# Patient Record
Sex: Female | Born: 1970 | Race: White | Hispanic: No | Marital: Married | Smoking: Never smoker
Health system: Southern US, Community
[De-identification: ages and names within clinical notes are randomized; demographics above are authoritative.]

## PROBLEM LIST (undated history)

## (undated) DIAGNOSIS — I10 Essential (primary) hypertension: Secondary | ICD-10-CM

## (undated) HISTORY — DX: Essential (primary) hypertension: I10

---

## 2005-01-09 HISTORY — PX: GALLBLADDER SURGERY: SHX652

## 2005-06-29 ENCOUNTER — Ambulatory Visit: Payer: Self-pay | Admitting: Internal Medicine

## 2005-09-18 ENCOUNTER — Other Ambulatory Visit: Payer: Self-pay

## 2005-09-25 ENCOUNTER — Ambulatory Visit: Payer: Self-pay | Admitting: Surgery

## 2010-12-22 ENCOUNTER — Ambulatory Visit: Payer: Self-pay | Admitting: Obstetrics and Gynecology

## 2011-03-23 ENCOUNTER — Ambulatory Visit: Payer: Self-pay | Admitting: Obstetrics and Gynecology

## 2013-06-13 ENCOUNTER — Ambulatory Visit: Payer: Self-pay | Admitting: Physician Assistant

## 2014-01-20 ENCOUNTER — Ambulatory Visit: Payer: Self-pay

## 2014-08-12 ENCOUNTER — Other Ambulatory Visit: Payer: Self-pay | Admitting: Family Medicine

## 2014-08-12 DIAGNOSIS — I10 Essential (primary) hypertension: Secondary | ICD-10-CM

## 2014-08-12 NOTE — Telephone Encounter (Signed)
Please add for me and then send as med refill. Thanks.

## 2014-08-12 NOTE — Telephone Encounter (Signed)
Per allscripts patient takes Diovan 160/12.5mg  daily. Can you send in med without it being in her chart? I've tried exporting chart to epic. Im not sure how long it takes before you can see med in EMR.

## 2014-08-12 NOTE — Telephone Encounter (Signed)
Pt called needing refill on her diovan  mg ? bid.  She said you have not prescribed this for her but she talked to you about it and you said to call when you needed a refill..  [facility] [place].    Thanks Barth Kirks

## 2014-08-18 MED ORDER — VALSARTAN-HYDROCHLOROTHIAZIDE 320-12.5 MG PO TABS: 1.0000 | ORAL_TABLET | Freq: Every day | ORAL | Status: DC

## 2014-08-18 NOTE — Telephone Encounter (Signed)
Dr. Judie Petit, have you already sent this in for patient? Looks like you have, but I just wanted to be sure. Thanks!

## 2014-08-18 NOTE — Telephone Encounter (Signed)
Pt has called back  .  She is almost out of her medication.    She uses [facility] in [place].

## 2014-08-20 ENCOUNTER — Telehealth: Payer: Self-pay | Admitting: Family Medicine

## 2014-08-20 DIAGNOSIS — I10 Essential (primary) hypertension: Secondary | ICD-10-CM | POA: Insufficient documentation

## 2014-08-20 MED ORDER — VALSARTAN-HYDROCHLOROTHIAZIDE 160-12.5 MG PO TABS: 1.0000 | ORAL_TABLET | Freq: Two times a day (BID) | ORAL | Status: DC

## 2014-08-20 NOTE — Telephone Encounter (Signed)
Pt stated the wrong dose pf Valsartan was sent into the pharmacy and she would like the correct dose 160-12.5 mg twice a day sent in today because she will take her last dose tonight. Thanks TNP

## 2014-08-24 ENCOUNTER — Ambulatory Visit (INDEPENDENT_AMBULATORY_CARE_PROVIDER_SITE_OTHER): Payer: BC Managed Care – PPO | Admitting: Family Medicine

## 2014-08-24 ENCOUNTER — Ambulatory Visit: Payer: Self-pay | Admitting: Family Medicine

## 2014-08-24 ENCOUNTER — Encounter: Payer: Self-pay | Admitting: Family Medicine

## 2014-08-24 VITALS — BP 120/76 | HR 78 | Temp 98.3°F | Resp 16 | Ht 63.0 in | Wt 243.6 lb

## 2014-08-24 DIAGNOSIS — J309 Allergic rhinitis, unspecified: Secondary | ICD-10-CM | POA: Insufficient documentation

## 2014-08-24 DIAGNOSIS — L0293 Carbuncle, unspecified: Secondary | ICD-10-CM

## 2014-08-24 DIAGNOSIS — I10 Essential (primary) hypertension: Secondary | ICD-10-CM | POA: Insufficient documentation

## 2014-08-24 DIAGNOSIS — D649 Anemia, unspecified: Secondary | ICD-10-CM | POA: Insufficient documentation

## 2014-08-24 MED ORDER — DOXYCYCLINE HYCLATE 100 MG PO TABS: 100.0000 mg | ORAL_TABLET | Freq: Two times a day (BID) | ORAL | Status: DC

## 2014-08-24 NOTE — Progress Notes (Signed)
Subjective:     Patient ID: Erin Frye, female   DOB: 09/06/1970, 44 y.o.   MRN: [id number]  HPI  Chief Complaint  Patient presents with  . Skin Problem    Patient comes in office today with concerns of possible cyst/boil on her lower left abdomen that has been present since 8/13. Patient states that area is painful and she has been able to squeeze white pus from site.   States she has not had a cyst in this area. Reports son has has MRSA infections.   Review of Systems  Constitutional: Negative for fever and chills.       Objective:   Physical Exam  Constitutional: She appears well-developed and well-nourished. No distress.  Skin:  Left lower abdomen with tender, non-draining carbuncle. Underlying induration appreciated but no drainage on palpation.       Assessment:    1. Carbuncle - doxycycline (VIBRA-TABS) 100 MG tablet; Take 1 tablet (100 mg total) by mouth 2 (two) times daily.  Dispense: 14 tablet; Refill: 0    Plan:    Discussed use of warm compresses.

## 2014-08-24 NOTE — Patient Instructions (Signed)
Discussed use of warm compresses several x day. 

## 2014-12-04 ENCOUNTER — Encounter: Payer: Self-pay | Admitting: Physician Assistant

## 2014-12-04 ENCOUNTER — Ambulatory Visit (INDEPENDENT_AMBULATORY_CARE_PROVIDER_SITE_OTHER): Payer: BC Managed Care – PPO | Admitting: Physician Assistant

## 2014-12-04 VITALS — BP 120/70 | HR 87 | Temp 97.8°F | Resp 16 | Wt 244.0 lb

## 2014-12-04 DIAGNOSIS — R059 Cough, unspecified: Secondary | ICD-10-CM

## 2014-12-04 DIAGNOSIS — R05 Cough: Secondary | ICD-10-CM

## 2014-12-04 DIAGNOSIS — J40 Bronchitis, not specified as acute or chronic: Secondary | ICD-10-CM

## 2014-12-04 MED ORDER — AZITHROMYCIN 250 MG PO TABS: ORAL_TABLET | ORAL | Status: DC

## 2014-12-04 MED ORDER — HYDROCODONE-HOMATROPINE 5-1.5 MG/5ML PO SYRP
5.0000 mL | ORAL_SOLUTION | Freq: Three times a day (TID) | ORAL | Status: DC | PRN
Start: 2014-12-04 — End: 2015-02-12

## 2014-12-04 NOTE — Progress Notes (Signed)
Patient: Erin Frye Female    DOB: 08/12/1970   44 y.o.   MRN: [id number] Visit Date: 12/04/2014  Today's Provider: Margaretann LovelessJennifer M Doris Gruhn, PA-C   No chief complaint on file.  Subjective:    URI  This is a new problem. The current episode started in the past 7 days. There has been no fever. Associated symptoms include coughing (gets worst in the evening), headaches, a plugged ear sensation and wheezing. Pertinent negatives include no ear pain, sinus pain, sneezing or sore throat. She has tried antihistamine, decongestant and acetaminophen for the symptoms.  She was recently treated on November 4 for sinus infection with amoxicillin. She stated that she did get a little bit better but then started to show symptoms again. She states that this time she has a worsening cough. Her cough bothers her most in the evening and when she lies down.     Allergies  Allergen Reactions  . Codeine     itching   Previous Medications   ASCORBIC ACID (VITAMIN C) 100 MG TABLET    Take by mouth.   FERROUS SULFATE 325 (65 FE) MG TABLET    Take by mouth.   FEXOFENADINE (ALLEGRA) 180 MG TABLET    Take by mouth.   FLUTICASONE (FLONASE ALLERGY RELIEF) 50 MCG/ACT NASAL SPRAY    Place into the nose.   MELOXICAM (MOBIC) 7.5 MG TABLET    take 1 tablet by mouth twice a day after meals   MULTIPLE VITAMIN PO    Take by mouth.   NAPROXEN SODIUM (ANAPROX) 550 MG TABLET       VALSARTAN-HYDROCHLOROTHIAZIDE (DIOVAN HCT) 160-12.5 MG PER TABLET    Take 1 tablet by mouth 2 (two) times daily.    Review of Systems  Constitutional: Negative.   HENT: Negative for ear pain, sneezing and sore throat.   Respiratory: Positive for cough (gets worst in the evening), chest tightness and wheezing.   Cardiovascular: Negative.   Musculoskeletal: Positive for back pain (upper back. It hurst when coughing and breathing).  Neurological: Positive for headaches.    Social History  Substance Use Topics  . Smoking  status: Never Smoker   . Smokeless tobacco: Not on file  . Alcohol Use: 0.0 oz/week    0 Standard drinks or equivalent per week     Comment: rare   Objective:   BP 120/70 mmHg  Pulse 87  Temp(Src) 97.8 F (36.6 C) (Oral)  Resp 16  Wt 244 lb (110.678 kg)  LMP 12/02/2014  Physical Exam  Constitutional: She appears well-developed and well-nourished. No distress.  HENT:  Head: Normocephalic and atraumatic.  Right Ear: Hearing, tympanic membrane, external ear and ear canal normal.  Left Ear: Hearing, tympanic membrane, external ear and ear canal normal.  Nose: Nose normal.  Mouth/Throat: Uvula is midline, oropharynx is clear and moist and mucous membranes are normal. No oropharyngeal exudate.  Eyes: Conjunctivae are normal. Pupils are equal, round, and reactive to light. Right eye exhibits no discharge. Left eye exhibits no discharge. No scleral icterus.  Neck: Normal range of motion. Neck supple. No tracheal deviation present. No thyromegaly present.  Cardiovascular: Normal rate, regular rhythm and normal heart sounds.  Exam reveals no gallop and no friction rub.   No murmur heard. Pulmonary/Chest: Effort normal. No stridor. No respiratory distress. She has decreased breath sounds. She has no wheezes. She has no rhonchi. She has no rales.  Lymphadenopathy:    She has no  cervical adenopathy.  Skin: Skin is warm and dry. She is not diaphoretic.  Vitals reviewed.       Assessment & Plan:     1. Cough Worsening. Will treat with Hycodan cough syrup for nighttime cough. She may take as directed before sleep help her sleep at night. She is to call the office if her symptoms fail to improve or worsen. - HYDROcodone-homatropine (HYCODAN) 5-1.5 MG/5ML syrup; Take 5 mLs by mouth every 8 (eight) hours as needed for cough.  Dispense: 120 mL; Refill: 0  2. Bronchitis Worsening. Will treat with a Z-Pak as below. She's previously been treated with amoxicillin for sinusitis approximately one  month ago. Advised her to try to rest and to make sure to drink plenty of fluids to stay hydrated. She is to call the office if symptoms fail to improve or worsen. May consider chest x-ray if cough does not improve with treatment or if shortness of breath worsens. - azithromycin (ZITHROMAX) 250 MG tablet; Take 2 tablets PO on day one, and one tablet PO daily thereafter until completed.  Dispense: 6 tablet; Refill: 0       Margaretann Loveless, PA-C  [facility]

## 2014-12-04 NOTE — Patient Instructions (Signed)

## 2015-01-27 ENCOUNTER — Other Ambulatory Visit: Payer: Self-pay

## 2015-01-27 DIAGNOSIS — I10 Essential (primary) hypertension: Secondary | ICD-10-CM

## 2015-01-27 MED ORDER — VALSARTAN-HYDROCHLOROTHIAZIDE 160-12.5 MG PO TABS: 1.0000 | ORAL_TABLET | Freq: Two times a day (BID) | ORAL | Status: DC

## 2015-01-28 ENCOUNTER — Telehealth: Payer: Self-pay | Admitting: Family Medicine

## 2015-01-28 NOTE — Telephone Encounter (Signed)
Needs ov to review labs from GYN.  Thanks.

## 2015-01-28 NOTE — Telephone Encounter (Signed)
Left message to call back  

## 2015-01-28 NOTE — Telephone Encounter (Signed)
Advised patient as below. Patient reports that she will give [place] a call back to schedule appt.

## 2015-01-29 ENCOUNTER — Other Ambulatory Visit: Payer: Self-pay

## 2015-01-29 DIAGNOSIS — I10 Essential (primary) hypertension: Secondary | ICD-10-CM

## 2015-01-29 MED ORDER — VALSARTAN-HYDROCHLOROTHIAZIDE 160-12.5 MG PO TABS: 1.0000 | ORAL_TABLET | Freq: Two times a day (BID) | ORAL | Status: DC

## 2015-02-03 ENCOUNTER — Encounter: Payer: Self-pay | Admitting: Family Medicine

## 2015-02-12 ENCOUNTER — Encounter: Payer: Self-pay | Admitting: Physician Assistant

## 2015-02-12 ENCOUNTER — Ambulatory Visit (INDEPENDENT_AMBULATORY_CARE_PROVIDER_SITE_OTHER): Payer: BC Managed Care – PPO | Admitting: Physician Assistant

## 2015-02-12 VITALS — BP 108/72 | HR 86 | Resp 14 | Wt 240.0 lb

## 2015-02-12 DIAGNOSIS — E782 Mixed hyperlipidemia: Secondary | ICD-10-CM | POA: Diagnosis not present

## 2015-02-12 DIAGNOSIS — R7303 Prediabetes: Secondary | ICD-10-CM | POA: Insufficient documentation

## 2015-02-12 DIAGNOSIS — R7309 Other abnormal glucose: Secondary | ICD-10-CM

## 2015-02-12 DIAGNOSIS — N951 Menopausal and female climacteric states: Secondary | ICD-10-CM | POA: Diagnosis not present

## 2015-02-12 NOTE — Progress Notes (Signed)
Patient ID: Erin Frye, female   DOB: [birth year], 45 y.o.   MRN: [id number]       Patient: Erin Frye Female    DOB: December 12, 1970   45 y.o.   MRN: [id number] Visit Date: 02/12/2015  Today's Provider: Margaretann Loveless, PA-C   Chief Complaint  Patient presents with  . Abnormal Lab   Subjective:    HPI   Erin Frye is a 45 yr old female that is here to discuss labs that she had done through [facility]. She was told her cholesterol was high, that she was perimenopausal, her sugar was high and she should take Vit D supplement.     Allergies  Allergen Reactions  . Codeine     itching   Previous Medications   ASCORBIC ACID (VITAMIN C) 100 MG TABLET    Take by mouth.   FERROUS SULFATE 325 (65 FE) MG TABLET    Take by mouth.   FEXOFENADINE (ALLEGRA) 180 MG TABLET    Take by mouth.   FLUTICASONE (FLONASE ALLERGY RELIEF) 50 MCG/ACT NASAL SPRAY    Place into the nose.   MULTIPLE VITAMIN PO    Take by mouth.   NAPROXEN SODIUM (ANAPROX) 550 MG TABLET       VALSARTAN-HYDROCHLOROTHIAZIDE (DIOVAN HCT) 160-12.5 MG TABLET    Take 1 tablet by mouth 2 (two) times daily.    Review of Systems  Constitutional: Negative.   Respiratory: Negative.   Cardiovascular: Negative.   Musculoskeletal: Negative.   Neurological: Negative.     Social History  Substance Use Topics  . Smoking status: Never Smoker   . Smokeless tobacco: Never Used  . Alcohol Use: 0.0 oz/week    0 Standard drinks or equivalent per week     Comment: rare   Objective:   BP 108/72 mmHg  Pulse 86  Resp 14  Wt 240 lb (108.863 kg)  Physical Exam  Constitutional: She appears well-developed and well-nourished. No distress.  Neck: Normal range of motion. Neck supple. No tracheal deviation present. No thyromegaly present.  Cardiovascular: Normal rate, regular rhythm and normal heart sounds.  Exam reveals no gallop and no friction rub.   No murmur heard. Pulmonary/Chest: Effort  normal and breath sounds normal. No respiratory distress. She has no wheezes. She has no rales.  Lymphadenopathy:    She has no cervical adenopathy.  Skin: She is not diaphoretic.  Vitals reviewed.       Assessment & Plan:     1. Hypercholesterolemia with hypertriglyceridemia Advised to continue her exercise routine that she has started in early January. She is going 3 days per week and working out for about hour each time. She is doing cardio and weight training. I also gave her information about the Mediterranean diet. I feel this may be a good diet that she can easily follow for lifestyle changes. She states that she will look into it and do research as she is very apt to change her diet. I also advised that she may begin taking red yeast rice daily. I will see her back in 6 months to recheck her cholesterol and hemoglobin A1c. I will also check her vitamin D at that time since her gynecologist recommended for her to start a vitamin D supplement. She states that she thinks she is already taking vitamin D as she takes a multivitamin daily. She is to call the office if she develops any acute issues, questions or concerns.  2. Perimenopausal  FSH and LH were increased indicating possible perimenopause. She does state that she has had irregular menstrual cycles for a while now. She denies any mood swings or hot flashes or other menopausal symptoms. She is followed by Karren Cobble at [facility].  3. Elevated hemoglobin A1c Hemoglobin A1c on December 28 was 6.2. She is making lifestyle changes including adding physical activity and trying to watch her diet. I will see her back in 6 months to recheck her hemoglobin A1c at that time. She is to call the office if she has any acute issues, questions or concerns.       Margaretann Loveless, PA-C  [facility]

## 2015-02-12 NOTE — Patient Instructions (Signed)

## 2015-02-23 ENCOUNTER — Encounter: Payer: Self-pay | Admitting: Family Medicine

## 2015-03-30 DIAGNOSIS — M170 Bilateral primary osteoarthritis of knee: Secondary | ICD-10-CM | POA: Insufficient documentation

## 2015-04-10 ENCOUNTER — Other Ambulatory Visit: Payer: Self-pay | Admitting: *Deleted

## 2015-04-10 DIAGNOSIS — I10 Essential (primary) hypertension: Secondary | ICD-10-CM

## 2015-04-10 MED ORDER — VALSARTAN-HYDROCHLOROTHIAZIDE 320-25 MG PO TABS: 1.0000 | ORAL_TABLET | Freq: Every day | ORAL | Status: DC

## 2015-05-20 ENCOUNTER — Ambulatory Visit (INDEPENDENT_AMBULATORY_CARE_PROVIDER_SITE_OTHER): Payer: BC Managed Care – PPO | Admitting: Physician Assistant

## 2015-05-20 ENCOUNTER — Encounter: Payer: Self-pay | Admitting: Physician Assistant

## 2015-05-20 VITALS — BP 118/82 | HR 81 | Temp 98.5°F | Resp 14 | Wt 233.8 lb

## 2015-05-20 DIAGNOSIS — L0293 Carbuncle, unspecified: Secondary | ICD-10-CM

## 2015-05-20 DIAGNOSIS — H6091 Unspecified otitis externa, right ear: Secondary | ICD-10-CM

## 2015-05-20 MED ORDER — DOXYCYCLINE HYCLATE 100 MG PO TABS: 100.0000 mg | ORAL_TABLET | Freq: Two times a day (BID) | ORAL | Status: DC

## 2015-05-20 NOTE — Progress Notes (Signed)
Patient ID: Erin Frye, female   DOB: 04/17/1970, 45 y.o.   MRN: [id number]   Patient: Erin Frye Female    DOB: 03/22/1970   [age] y.o.   MRN: [id number] Visit Date: 05/20/2015  Today's Provider: Margaretann LovelessJennifer M Torryn Fiske, PA-C   Chief Complaint  Patient presents with  . Cyst  . Ear Pain   Subjective:    Otalgia  There is pain in the right ear. This is a new problem. The current episode started in the past 7 days. The problem occurs constantly. The problem has been unchanged. Pertinent negatives include no ear discharge, rhinorrhea or sore throat. Associated symptoms comments: Ear soreness. She has tried acetaminophen for the symptoms. The treatment provided no relief.   Cyst:   Patient reports red, raised area on left side of abdomen X 3 days. Symptoms include redness, heat, and discharge which have worsen since onset. She has had another once previously on lower abdomen last year in August.   Previous Medications   FERROUS SULFATE 325 (65 FE) MG TABLET    Take by mouth.   FEXOFENADINE (ALLEGRA) 180 MG TABLET    Take by mouth.   FLUTICASONE (FLONASE ALLERGY RELIEF) 50 MCG/ACT NASAL SPRAY    Place into the nose.   MULTIPLE VITAMIN PO    Take by mouth.   VALSARTAN-HYDROCHLOROTHIAZIDE (DIOVAN-HCT) 160-12.5 MG TABLET    Take 1 tablet by mouth 2 (two) times daily.   Allergies  Allergen Reactions  . Codeine     itching    Review of Systems  Constitutional: Negative.   HENT: Positive for ear pain. Negative for ear discharge, postnasal drip, rhinorrhea, sinus pressure, sneezing, sore throat and tinnitus.   Eyes: Negative.   Respiratory: Negative.   Cardiovascular: Negative.   Gastrointestinal: Negative.   Endocrine: Negative.   Genitourinary: Negative.   Musculoskeletal: Negative.   Skin: Positive for wound.       cyst  Allergic/Immunologic: Negative.   Neurological: Negative.   Hematological: Negative.   Psychiatric/Behavioral: Negative.     Social  History  Substance Use Topics  . Smoking status: Never Smoker   . Smokeless tobacco: Never Used  . Alcohol Use: 0.0 oz/week    0 Standard drinks or equivalent per week     Comment: rare   Objective:   BP 118/82 mmHg  Pulse 81  Temp(Src) 98.5 F (36.9 C) (Oral)  Resp 14  Wt 233 lb 12.8 oz (106.051 kg)  Physical Exam  Constitutional: She appears well-developed and well-nourished. No distress.  HENT:  Head: Normocephalic and atraumatic.  Right Ear: Hearing, tympanic membrane and ear canal normal. There is swelling. Tympanic membrane is not perforated, not erythematous and not bulging. No middle ear effusion.  Left Ear: Hearing, tympanic membrane, external ear and ear canal normal.  Nose: Nose normal. Right sinus exhibits no maxillary sinus tenderness and no frontal sinus tenderness. Left sinus exhibits no maxillary sinus tenderness and no frontal sinus tenderness.  Mouth/Throat: Uvula is midline, oropharynx is clear and moist and mucous membranes are normal. No oropharyngeal exudate, posterior oropharyngeal edema or posterior oropharyngeal erythema.  Eyes: Conjunctivae are normal. Pupils are equal, round, and reactive to light. Right eye exhibits no discharge. Left eye exhibits no discharge. No scleral icterus.  Neck: Normal range of motion. Neck supple. No tracheal deviation present. No thyromegaly present.  Cardiovascular: Normal rate, regular rhythm and normal heart sounds.  Exam reveals no gallop and no friction rub.   No murmur heard.  Pulmonary/Chest: Effort normal and breath sounds normal. No stridor. No respiratory distress. She has no wheezes. She has no rales.  Abdominal: There is no tenderness.    Lymphadenopathy:    She has no cervical adenopathy.  Skin: Skin is warm and dry. She is not diaphoretic.  Vitals reviewed.       Assessment & Plan:     1. Carbuncle Being that it started autodraining today I will not I & D. Will add doxycycline as below. Warm compresses.  Epsom saly soaks. She is to call if no improvement.  - doxycycline (VIBRA-TABS) 100 MG tablet; Take 1 tablet (100 mg total) by mouth 2 (two) times daily.  Dispense: 20 tablet; Refill: 0  2. Otitis externa, right May see benefit with doxycycline. If ear pain persists after antibiotic she is to call for recheck.   Follow up: No Follow-up on file.

## 2015-05-20 NOTE — Patient Instructions (Signed)
Otitis Externa Otitis externa is a bacterial or fungal infection of the outer ear canal. This is the area from the eardrum to the outside of the ear. Otitis externa is sometimes called "swimmer's ear." CAUSES  Possible causes of infection include:  Swimming in dirty water.  Moisture remaining in the ear after swimming or bathing.  Mild injury (trauma) to the ear.  Objects stuck in the ear (foreign body).  Cuts or scrapes (abrasions) on the outside of the ear. SIGNS AND SYMPTOMS  The first symptom of infection is often itching in the ear canal. Later signs and symptoms may include swelling and redness of the ear canal, ear pain, and yellowish-white fluid (pus) coming from the ear. The ear pain may be worse when pulling on the earlobe. DIAGNOSIS  Your health care provider will perform a physical exam. A sample of fluid may be taken from the ear and examined for bacteria or fungi. TREATMENT  Antibiotic ear drops are often given for 10 to 14 days. Treatment may also include pain medicine or corticosteroids to reduce itching and swelling. HOME CARE INSTRUCTIONS   Apply antibiotic ear drops to the ear canal as prescribed by your health care provider.  Take medicines only as directed by your health care provider.  If you have diabetes, follow any additional treatment instructions from your health care provider.  Keep all follow-up visits as directed by your health care provider. PREVENTION   Keep your ear dry. Use the corner of a towel to absorb water out of the ear canal after swimming or bathing.  Avoid scratching or putting objects inside your ear. This can damage the ear canal or remove the protective wax that lines the canal. This makes it easier for bacteria and fungi to grow.  Avoid swimming in lakes, polluted water, or poorly chlorinated pools.  You may use ear drops made of rubbing alcohol and vinegar after swimming. Combine equal parts of white vinegar and alcohol in a bottle.  Put 3 or 4 drops into each ear after swimming. SEEK MEDICAL CARE IF:   You have a fever.  Your ear is still red, swollen, painful, or draining pus after 3 days.  Your redness, swelling, or pain gets worse.  You have a severe headache.  You have redness, swelling, pain, or tenderness in the area behind your ear. MAKE SURE YOU:   Understand these instructions.  Will watch your condition.  Will get help right away if you are not doing well or get worse.   This information is not intended to replace advice given to you by your health care provider. Make sure you discuss any questions you have with your health care provider.   Document Released: 12/26/2004 Document Revised: 01/16/2014 Document Reviewed: 01/12/2011 Elsevier Interactive Patient Education 2016 [employer] Abscess An abscess is an infected area that contains a collection of pus and debris.It can occur in almost any part of the body. An abscess is also known as a furuncle or boil. CAUSES  An abscess occurs when tissue gets infected. This can occur from blockage of oil or sweat glands, infection of hair follicles, or a minor injury to the skin. As the body tries to fight the infection, pus collects in the area and creates pressure under the skin. This pressure causes pain. People with weakened immune systems have difficulty fighting infections and get certain abscesses more often.  SYMPTOMS Usually an abscess develops on the skin and becomes a painful mass that is red, warm, and  tender. If the abscess forms under the skin, you may feel a moveable soft area under the skin. Some abscesses break open (rupture) on their own, but most will continue to get worse without care. The infection can spread deeper into the body and eventually into the bloodstream, causing you to feel ill.  DIAGNOSIS  Your caregiver will take your medical history and perform a physical exam. A sample of fluid may also be taken from the abscess to determine  what is causing your infection. TREATMENT  Your caregiver may prescribe antibiotic medicines to fight the infection. However, taking antibiotics alone usually does not cure an abscess. Your caregiver may need to make a small cut (incision) in the abscess to drain the pus. In some cases, gauze is packed into the abscess to reduce pain and to continue draining the area. HOME CARE INSTRUCTIONS   Only take over-the-counter or prescription medicines for pain, discomfort, or fever as directed by your caregiver.  If you were prescribed antibiotics, take them as directed. Finish them even if you start to feel better.  If gauze is used, follow your caregiver's directions for changing the gauze.  To avoid spreading the infection:  Keep your draining abscess covered with a bandage.  Wash your hands well.  Do not share personal care items, towels, or whirlpools with others.  Avoid skin contact with others.  Keep your skin and clothes clean around the abscess.  Keep all follow-up appointments as directed by your caregiver. SEEK MEDICAL CARE IF:   You have increased pain, swelling, redness, fluid drainage, or bleeding.  You have muscle aches, chills, or a general ill feeling.  You have a fever. MAKE SURE YOU:   Understand these instructions.  Will watch your condition.  Will get help right away if you are not doing well or get worse.   This information is not intended to replace advice given to you by your health care provider. Make sure you discuss any questions you have with your health care provider.   Document Released: 10/05/2004 Document Revised: 06/27/2011 Document Reviewed: 03/10/2011 Elsevier Interactive Patient Education [employer]

## 2015-08-12 ENCOUNTER — Ambulatory Visit (INDEPENDENT_AMBULATORY_CARE_PROVIDER_SITE_OTHER): Payer: BC Managed Care – PPO | Admitting: Physician Assistant

## 2015-08-12 ENCOUNTER — Encounter: Payer: Self-pay | Admitting: Physician Assistant

## 2015-08-12 VITALS — BP 120/84 | HR 84 | Temp 98.3°F | Resp 16 | Wt 239.2 lb

## 2015-08-12 DIAGNOSIS — I10 Essential (primary) hypertension: Secondary | ICD-10-CM

## 2015-08-12 DIAGNOSIS — E782 Mixed hyperlipidemia: Secondary | ICD-10-CM | POA: Diagnosis not present

## 2015-08-12 DIAGNOSIS — R7309 Other abnormal glucose: Secondary | ICD-10-CM

## 2015-08-12 NOTE — Progress Notes (Signed)
       Patient: Erin Frye Female    DOB: 09/25/1970   45 y.o.   MRN: [id number] Visit Date: 08/12/2015  Today's Provider: Margaretann Loveless, PA-C   Chief Complaint  Patient presents with  . Follow-up    Cholesterol,Hgb A1C and Vitamin D.   Subjective:    HPI Patient is here for 6 months follow-up Cholesterol and pre-diabetes. She is exercising and eating healthier. She reports taking the Red Yeast daily. No symptoms. She is also taking valsartan-hctz for her HTN and doing well. No side effects. No chest pain, SOB, edema, visual changes, headaches or dizziness.    Allergies  Allergen Reactions  . Codeine     itching   Current Meds  Medication Sig  . ferrous sulfate 325 (65 FE) MG tablet Take by mouth.  . fexofenadine (ALLEGRA) 180 MG tablet Take by mouth.  . fluticasone (FLONASE ALLERGY RELIEF) 50 MCG/ACT nasal spray Place into the nose.  . MULTIPLE VITAMIN PO Take by mouth.  . Red Yeast Rice Extract (RED YEAST RICE PO) Take by mouth daily.  . valsartan-hydrochlorothiazide (DIOVAN-HCT) 160-12.5 MG tablet Take 1 tablet by mouth 2 (two) times daily.    Review of Systems  Constitutional: Negative for activity change, appetite change and fatigue.  Respiratory: Negative for cough, chest tightness and shortness of breath.   Cardiovascular: Negative for chest pain, palpitations and leg swelling.  Gastrointestinal: Negative for abdominal pain.  Endocrine: Negative for polydipsia.  Genitourinary: Negative for frequency.  Neurological: Negative for dizziness and headaches.    Social History  Substance Use Topics  . Smoking status: Never Smoker  . Smokeless tobacco: Never Used  . Alcohol use 0.0 oz/week     Comment: rare   Objective:   BP 120/84 (BP Location: Left Arm, Patient Position: Sitting, Cuff Size: Normal)   Pulse 84   Temp 98.3 F (36.8 C) (Oral)   Resp 16   Wt 239 lb 3.2 oz (108.5 kg)   BMI 42.37 kg/m   Physical Exam  Constitutional: She  appears well-developed and well-nourished. No distress.  Cardiovascular: Normal rate, regular rhythm and normal heart sounds.  Exam reveals no gallop and no friction rub.   No murmur heard. Pulmonary/Chest: Effort normal and breath sounds normal. No respiratory distress. She has no wheezes. She has no rales.  Musculoskeletal: She exhibits no edema.  Skin: She is not diaphoretic.  Vitals reviewed.      Assessment & Plan:     1. Hypercholesterolemia with hypertriglyceridemia Will check labs as below and f/u pending results. Continue red yeast rice for the time being. Will recheck in 6 months. - Lipid panel  2. Elevated hemoglobin A1c Will check labs as below and f/u pending results. Continue lifestyle modifications. Will recheck in 6 months. - Hemoglobin A1c  3. Essential hypertension Stable. Continue current medical treatment plan with valsartan-hctz 160-12.5 BID. Will recheck in 6 months.  - CBC with Differential - Basic Metabolic Panel (BMET)       Margaretann Loveless, PA-C  [facility]

## 2015-08-12 NOTE — Patient Instructions (Addendum)
Fat and Cholesterol Restricted Diet  Getting too much fat and cholesterol in your diet may cause health problems. Following this diet helps keep your fat and cholesterol at normal levels. This can keep you from getting sick.  WHAT TYPES OF FAT SHOULD I CHOOSE?  · Choose monosaturated and polyunsaturated fats. These are found in foods such as olive oil, canola oil, flaxseeds, walnuts, almonds, and seeds.  · Eat more omega-3 fats. Good choices include salmon, mackerel, sardines, tuna, flaxseed oil, and ground flaxseeds.  · Limit saturated fats. These are in animal products such as meats, butter, and cream. They can also be in plant products such as palm oil, palm kernel oil, and coconut oil.  ·  Avoid foods with partially hydrogenated oils in them. These contain trans fats. Examples of foods that have trans fats are stick margarine, some tub margarines, cookies, crackers, and other baked goods.  WHAT GENERAL GUIDELINES DO I NEED TO FOLLOW?   · Check food labels. Look for the words "trans fat" and "saturated fat."  · When preparing a meal:    Fill half of your plate with vegetables and green salads.    Fill one fourth of your plate with whole grains. Look for the word "whole" as the first word in the ingredient list.    Fill one fourth of your plate with lean protein foods.  · Limit fruit to two servings a day. Choose fruit instead of juice.  · Eat more foods with soluble fiber. Examples of foods with this type of fiber are apples, broccoli, carrots, beans, peas, and barley. Try to get 20-30 g (grams) of fiber per day.  · Eat more home-cooked foods. Eat less at restaurants and buffets.  · Limit or avoid alcohol.  · Limit foods high in starch and sugar.  · Limit fried foods.  · Cook foods without frying them. Baking, boiling, grilling, and broiling are all great options.  · Lose weight if you are overweight. Losing even a small amount of weight can help your overall health. It can also help prevent diseases such as  diabetes and heart disease.  WHAT FOODS CAN I EAT?  Grains  Whole grains, such as whole wheat or whole grain breads, crackers, cereals, and pasta. Unsweetened oatmeal, bulgur, barley, quinoa, or brown rice. Corn or whole wheat flour tortillas.  Vegetables  Fresh or frozen vegetables (raw, steamed, roasted, or grilled). Green salads.  Fruits  All fresh, canned (in natural juice), or frozen fruits.  Meat and Other Protein Products  Ground beef (85% or leaner), grass-fed beef, or beef trimmed of fat. Skinless chicken or turkey. Ground chicken or turkey. Pork trimmed of fat. All fish and seafood. Eggs. Dried beans, peas, or lentils. Unsalted nuts or seeds. Unsalted canned or dry beans.  Dairy  Low-fat dairy products, such as skim or 1% milk, 2% or reduced-fat cheeses, low-fat ricotta or cottage cheese, or plain low-fat yogurt.  Fats and Oils  Tub margarines without trans fats. Light or reduced-fat mayonnaise and salad dressings. Avocado. Olive, canola, sesame, or safflower oils. Natural peanut or almond butter (choose ones without added sugar and oil).  The items listed above may not be a complete list of recommended foods or beverages. Contact your dietitian for more options.  WHAT FOODS ARE NOT RECOMMENDED?  Grains  White bread. White pasta. White rice. Cornbread. Bagels, pastries, and croissants. Crackers that contain trans fat.  Vegetables  White potatoes. Corn. Creamed or fried vegetables. Vegetables in a cheese sauce.    Fruits  Dried fruits. Canned fruit in light or heavy syrup. Fruit juice.  Meat and Other Protein Products  Fatty cuts of meat. Ribs, chicken wings, bacon, sausage, bologna, salami, chitterlings, fatback, hot dogs, bratwurst, and packaged luncheon meats. Liver and organ meats.  Dairy  Whole or 2% milk, cream, half-and-half, and cream cheese. Whole milk cheeses. Whole-fat or sweetened yogurt. Full-fat cheeses. Nondairy creamers and whipped toppings. Processed cheese, cheese spreads, or cheese  curds.  Sweets and Desserts  Corn syrup, sugars, honey, and molasses. Candy. Jam and jelly. Syrup. Sweetened cereals. Cookies, pies, cakes, donuts, muffins, and ice cream.  Fats and Oils  Butter, stick margarine, lard, shortening, ghee, or bacon fat. Coconut, palm kernel, or palm oils.  Beverages  Alcohol. Sweetened drinks (such as sodas, lemonade, and fruit drinks or punches).  The items listed above may not be a complete list of foods and beverages to avoid. Contact your dietitian for more information.     This information is not intended to replace advice given to you by your health care provider. Make sure you discuss any questions you have with your health care provider.     Document Released: 06/27/2011 Document Revised: 01/16/2014 Document Reviewed: 03/27/2013  Elsevier Interactive Patient Education ©2016 [employer]

## 2015-08-13 ENCOUNTER — Telehealth: Payer: Self-pay

## 2015-08-13 LAB — CBC WITH DIFFERENTIAL/PLATELET
Basophils Absolute: 0.1 10*3/uL (ref 0.0–0.2)
Basos: 1 %
EOS (ABSOLUTE): 0.3 10*3/uL (ref 0.0–0.4)
EOS: 3 %
HEMATOCRIT: 39.6 % (ref 34.0–46.6)
Hemoglobin: 12.8 g/dL (ref 11.1–15.9)
IMMATURE GRANS (ABS): 0 10*3/uL (ref 0.0–0.1)
IMMATURE GRANULOCYTES: 0 %
LYMPHS: 32 %
Lymphocytes Absolute: 3.1 10*3/uL (ref 0.7–3.1)
MCH: 29.8 pg (ref 26.6–33.0)
MCHC: 32.3 g/dL (ref 31.5–35.7)
MCV: 92 fL (ref 79–97)
MONOS ABS: 0.6 10*3/uL (ref 0.1–0.9)
Monocytes: 6 %
NEUTROS PCT: 58 %
Neutrophils Absolute: 5.6 10*3/uL (ref 1.4–7.0)
PLATELETS: 361 10*3/uL (ref 150–379)
RBC: 4.3 x10E6/uL (ref 3.77–5.28)
RDW: 13.5 % (ref 12.3–15.4)
WBC: 9.7 10*3/uL (ref 3.4–10.8)

## 2015-08-13 LAB — BASIC METABOLIC PANEL
BUN/Creatinine Ratio: 25 — ABNORMAL HIGH (ref 9–23)
BUN: 17 mg/dL (ref 6–24)
CALCIUM: 9.9 mg/dL (ref 8.7–10.2)
CHLORIDE: 97 mmol/L (ref 96–106)
CO2: 26 mmol/L (ref 18–29)
Creatinine, Ser: 0.69 mg/dL (ref 0.57–1.00)
GFR calc Af Amer: 120 mL/min/{1.73_m2} (ref >59)
GFR, EST NON AFRICAN AMERICAN: 104 mL/min/{1.73_m2} (ref >59)
Glucose: 98 mg/dL (ref 65–99)
POTASSIUM: 4.2 mmol/L (ref 3.5–5.2)
Sodium: 140 mmol/L (ref 134–144)

## 2015-08-13 LAB — LIPID PANEL
CHOL/HDL RATIO: 6.6 ratio — AB (ref 0.0–4.4)
Cholesterol, Total: 332 mg/dL — ABNORMAL HIGH (ref 100–199)
HDL: 50 mg/dL (ref >39)
Triglycerides: 523 mg/dL — ABNORMAL HIGH (ref 0–149)

## 2015-08-13 LAB — HEMOGLOBIN A1C
Est. average glucose Bld gHb Est-mCnc: 123 mg/dL
Hgb A1c MFr Bld: 5.9 % — ABNORMAL HIGH (ref 4.8–5.6)

## 2015-08-13 MED ORDER — SIMVASTATIN 20 MG PO TABS: 20.0000 mg | ORAL_TABLET | Freq: Every day | ORAL | 1 refills | Status: DC

## 2015-08-13 NOTE — Telephone Encounter (Signed)
Pt advised. Rx was sent to [facility] already.   Thanks,   -Vernona Rieger

## 2015-08-13 NOTE — Telephone Encounter (Signed)
LMTCB 08/13/2015  Thanks,   -Vee Bahe  

## 2015-08-13 NOTE — Addendum Note (Signed)
Addended by: Margaretann Loveless on: 08/13/2015 01:05 PM   Modules accepted: Orders

## 2015-08-13 NOTE — Telephone Encounter (Signed)
-----   Message from Margaretann Loveless, [place] sent at 08/13/2015  1:04 PM EDT ----- Cholesterol is still quite elevated. I do feel you would benefit from a cholesterol lowering medication. I will send this in the the pharmacy we have on file. I do think we should recheck a fasting cholesterol again in 6 months. Continue also working on diet and exercise. AHA recommends 150 min/week of moderate physical activity (anything that increases your HR). HgBA1c is also elevated at 5.9. We will recheck this as well in 6 months.

## 2015-08-18 ENCOUNTER — Ambulatory Visit (INDEPENDENT_AMBULATORY_CARE_PROVIDER_SITE_OTHER): Payer: BC Managed Care – PPO | Admitting: Podiatry

## 2015-08-18 ENCOUNTER — Ambulatory Visit (INDEPENDENT_AMBULATORY_CARE_PROVIDER_SITE_OTHER): Payer: BC Managed Care – PPO

## 2015-08-18 ENCOUNTER — Encounter: Payer: Self-pay | Admitting: Podiatry

## 2015-08-18 VITALS — BP 119/78 | HR 89 | Resp 12

## 2015-08-18 DIAGNOSIS — M779 Enthesopathy, unspecified: Secondary | ICD-10-CM

## 2015-08-18 DIAGNOSIS — M2041 Other hammer toe(s) (acquired), right foot: Secondary | ICD-10-CM

## 2015-08-18 DIAGNOSIS — M7751 Other enthesopathy of right foot: Secondary | ICD-10-CM

## 2015-08-18 DIAGNOSIS — M778 Other enthesopathies, not elsewhere classified: Secondary | ICD-10-CM

## 2015-08-18 NOTE — Progress Notes (Signed)
   Subjective:    Patient ID: Erin Frye, female    DOB: 03/29/1970, 45 y.o.   MRN: [id number]  HPI: She presents today with a chief complaint of pain to the right foot around the second toe she states this seems to be curving inward and is becoming more painful over the past 4 months. The toe is worse all of time she's tried no treatment for it.    Review of Systems  All other systems reviewed and are negative.      Objective:   Physical Exam: Vital signs are stable alert and oriented 3. Pulses are palpable. Neurologic sensorium is intact. Deep tendon reflexes are intact. Muscle strength is 5 over 5 dorsiflexion plantar flexors and inverters and everters all inches musculature is intact. Orthopedic evaluation demonstrates pain on palpation second metatarsophalangeal joint of the right foot. She also has mild HAV deformities. Radiographs do demonstrate elongated second metatarsal with hammertoe deformity.        Assessment & Plan:  Capsulitis second metatarsophalangeal joint of the right foot with hammertoe deformity.  Plan: I injected around the joint today.. I injected directly into the joint after Betadine skin prep dexamethasone and local anesthetic. Follow-up with her in 1 month. We did discuss the possible need for surgical intervention.

## 2015-09-17 ENCOUNTER — Other Ambulatory Visit: Payer: Self-pay | Admitting: Family Medicine

## 2015-09-17 DIAGNOSIS — I10 Essential (primary) hypertension: Secondary | ICD-10-CM

## 2015-09-20 ENCOUNTER — Ambulatory Visit: Payer: BC Managed Care – PPO | Admitting: Podiatry

## 2015-09-27 ENCOUNTER — Ambulatory Visit (INDEPENDENT_AMBULATORY_CARE_PROVIDER_SITE_OTHER): Payer: BC Managed Care – PPO | Admitting: Podiatry

## 2015-09-27 ENCOUNTER — Encounter: Payer: Self-pay | Admitting: Podiatry

## 2015-09-27 DIAGNOSIS — M7751 Other enthesopathy of right foot: Secondary | ICD-10-CM | POA: Diagnosis not present

## 2015-09-27 DIAGNOSIS — M778 Other enthesopathies, not elsewhere classified: Secondary | ICD-10-CM

## 2015-09-27 DIAGNOSIS — M779 Enthesopathy, unspecified: Principal | ICD-10-CM

## 2015-09-27 NOTE — Progress Notes (Signed)
She presents today for follow-up of her second metatarsophalangeal joint capsulitis. She states it is still quite tender and really has not improved.  Objective: Vital signs are stable alert and oriented 3. Pulses are palpable. Neurologic sensory is intact. Deep tendon reflexes are intact. She is pain on palpation and in range of motion as well as distraction of the second metatarsophalangeal joint right foot.  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  Plan: Intra-articular injection of dexamethasone with local anesthetic today. She tolerated this procedure well. We'll consider a tenotomy capsulotomy in the office or a surgical procedure at the surgery center.

## 2015-10-27 ENCOUNTER — Ambulatory Visit (INDEPENDENT_AMBULATORY_CARE_PROVIDER_SITE_OTHER): Payer: BC Managed Care – PPO | Admitting: Podiatry

## 2015-10-27 DIAGNOSIS — M7751 Other enthesopathy of right foot: Secondary | ICD-10-CM

## 2015-10-27 DIAGNOSIS — M2041 Other hammer toe(s) (acquired), right foot: Secondary | ICD-10-CM

## 2015-10-27 DIAGNOSIS — M778 Other enthesopathies, not elsewhere classified: Secondary | ICD-10-CM

## 2015-10-27 DIAGNOSIS — M779 Enthesopathy, unspecified: Principal | ICD-10-CM

## 2015-10-27 NOTE — Patient Instructions (Signed)

## 2015-10-27 NOTE — Progress Notes (Signed)
She presents today to complaint of continued pain about the second metatarsal phalangeal joint of the right foot. She states that the toe is also painful she reverses second digit right foot. She states that the injections have not helped relieve her symptoms completely. She is starting to develop symptoms that prevent her from performing her normal daily activities.  Objective: I have reviewed her past medical history medications allergies surgeries and social history. Pulses are palpable. Neurologic sensorium is intact. She still has moderate to severe pain on palpation and range of motion of the second metatarsophalangeal joint and hammertoe deformity is rigid and fixed at the PIPJ. There is some abduction at the PIPJ as well. No open lesions or wounds.  Assessment: Capsulitis hammertoe deformity second metatarsophalangeal joint right foot.  Plan: We discussed etiology pathology conservative versus surgical therapies. At this point he consented her for a second metatarsal osteotomy and hammertoe repair with screw and/or pin. I answered all the questions regarding this procedure to the best of my ability in layman's terms. We did discuss possible postop complications which may include but are not limited to postop pain bleeding swelling infection and recurrence. I expressed to her that she would need at least 2 weeks out of work prior to returning in her [employer]. She understands she may have a pin sticking out the end of her second toe for a period of 6-8 weeks. We provided her with both oral and written home going instructions for preoperative management as well as information for the surgical center and anesthesia. I will follow-up with her in the near future for surgical intervention.

## 2016-02-11 ENCOUNTER — Other Ambulatory Visit: Payer: Self-pay | Admitting: Physician Assistant

## 2016-02-11 DIAGNOSIS — E782 Mixed hyperlipidemia: Secondary | ICD-10-CM

## 2016-02-18 ENCOUNTER — Ambulatory Visit: Payer: BC Managed Care – PPO | Admitting: Family Medicine

## 2016-02-18 ENCOUNTER — Encounter: Payer: Self-pay | Admitting: Family Medicine

## 2016-02-18 ENCOUNTER — Ambulatory Visit (INDEPENDENT_AMBULATORY_CARE_PROVIDER_SITE_OTHER): Payer: BC Managed Care – PPO | Admitting: Family Medicine

## 2016-02-18 VITALS — BP 128/88 | HR 99 | Temp 98.0°F | Resp 16 | Wt 236.2 lb

## 2016-02-18 DIAGNOSIS — J029 Acute pharyngitis, unspecified: Secondary | ICD-10-CM | POA: Diagnosis not present

## 2016-02-18 DIAGNOSIS — J0100 Acute maxillary sinusitis, unspecified: Secondary | ICD-10-CM | POA: Diagnosis not present

## 2016-02-18 LAB — POCT RAPID STREP A (OFFICE): RAPID STREP A SCREEN: NEGATIVE

## 2016-02-18 MED ORDER — AMOXICILLIN-POT CLAVULANATE 875-125 MG PO TABS
1.0000 | ORAL_TABLET | Freq: Two times a day (BID) | ORAL | 0 refills | Status: DC
Start: 2016-02-18 — End: 2016-02-19

## 2016-02-18 NOTE — Progress Notes (Signed)
Patient: Erin Frye Female    DOB: 10/05/1970   46 y.o.   MRN: [id number] Visit Date: 02/18/2016  Today's Provider: Dortha Kernennis Charlei Ramsaran, PA   Chief Complaint  Patient presents with  . Sinusitis   Subjective:    Sinusitis  This is a new problem. The current episode started in the past 7 days. The problem has been gradually worsening since onset. Associated symptoms include coughing, sinus pressure and a sore throat. Past treatments include acetaminophen. The treatment provided mild relief.   Patient Active Problem List   Diagnosis Date Noted  . Primary osteoarthritis of both knees 03/30/2015  . Hypercholesterolemia with hypertriglyceridemia 02/12/2015  . Perimenopausal 02/12/2015  . Elevated hemoglobin A1c 02/12/2015  . Allergic rhinitis 08/24/2014  . Absolute anemia 08/24/2014  . Essential hypertension 08/20/2014   Past Surgical History:  Procedure Laterality Date  . CESAREAN SECTION  2004  . GALLBLADDER SURGERY  2007   Family History  Problem Relation Age of Onset  . Diabetes Mother   . Healthy Brother    Allergies  Allergen Reactions  . Codeine     itching     Previous Medications   FERROUS SULFATE 325 (65 FE) MG TABLET    Take by mouth.   FEXOFENADINE (ALLEGRA) 180 MG TABLET    Take by mouth.   FLUTICASONE (FLONASE ALLERGY RELIEF) 50 MCG/ACT NASAL SPRAY    Place into the nose.   MULTIPLE VITAMIN PO    Take by mouth.   RED YEAST RICE EXTRACT (RED YEAST RICE PO)    Take by mouth daily.   SIMVASTATIN (ZOCOR) 20 MG TABLET    take 1 tablet by mouth at bedtime   VALSARTAN-HYDROCHLOROTHIAZIDE (DIOVAN-HCT) 160-12.5 MG TABLET    TAKE 1 TABLET TWICE A DAY. (WARNING: NOT TO TAKE IF   PREGNANT OR TRYING TO GET  PREGNANT)    Review of Systems  Constitutional: Negative.   HENT: Positive for sinus pressure and sore throat.   Respiratory: Positive for cough.   Cardiovascular: Negative.     Social History  Substance Use Topics  . Smoking status: Never Smoker  .  Smokeless tobacco: Never Used  . Alcohol use 0.0 oz/week     Comment: rare   Objective:   BP 128/88 (BP Location: Right Arm, Patient Position: Sitting, Cuff Size: Normal)   Pulse 99   Temp 98 F (36.7 C) (Oral)   Resp 16   Wt 236 lb 3.2 oz (107.1 kg)   SpO2 98%   BMI 41.84 kg/m   Physical Exam  Constitutional: She is oriented to person, place, and time. She appears well-developed and well-nourished. No distress.  HENT:  Head: Normocephalic and atraumatic.  Right Ear: Hearing and external ear normal.  Left Ear: Hearing and external ear normal.  Nose: Nose normal.  Hyperemic posterior pharynx. Tender maxillary sinuses with no transillumination on the left..  Eyes: Conjunctivae and lids are normal. Right eye exhibits no discharge. Left eye exhibits no discharge. No scleral icterus.  Neck: Neck supple.  Cardiovascular: Normal rate and regular rhythm.   Pulmonary/Chest: Effort normal and breath sounds normal. No respiratory distress.  Musculoskeletal: Normal range of motion.  Lymphadenopathy:    She has no cervical adenopathy.  Neurological: She is alert and oriented to person, place, and time.  Skin: Skin is intact. No lesion and no rash noted.  Psychiatric: She has a normal mood and affect. Her speech is normal and behavior is normal. Thought content normal.  Assessment & Plan:     1. Sore throat Onset last night and still sore this morning. Strep test negative. No fevers or rashes. May use salt water gargles prn. - POCT rapid strep A  2. Acute maxillary sinusitis, recurrence not specified Congestion and pain in upper teeth the past few days. Feels some PND with slight ticklish cough. Some bloody mucus from nose occasionally. May continue allergy medication (Flonase and Allegra). Add Mucinex-DM and Augmentin for sinusitis with cough. Increase fluid intake and recheck prn. - amoxicillin-clavulanate (AUGMENTIN) 875-125 MG tablet; Take 1 tablet by mouth 2 (two) times daily.   Dispense: 20 tablet; Refill: 0

## 2016-02-18 NOTE — Patient Instructions (Signed)

## 2016-02-19 ENCOUNTER — Telehealth: Payer: Self-pay | Admitting: Family Medicine

## 2016-02-19 MED ORDER — AMOXICILLIN 500 MG PO CAPS: 1000.0000 mg | ORAL_CAPSULE | Freq: Three times a day (TID) | ORAL | 0 refills | Status: DC

## 2016-02-19 NOTE — Telephone Encounter (Signed)
Patient states that she saw Maurine Ministerennis on 02/18/2016.  He gave her amoxicillin-clavulanate (AUGMENTIN) 875-125 MG tablet and it is making her have really bad diarrhea and she does not think she will be able to work like this. She would like to know if you could give her a different medication.  [facility] [place].  Please call patient and let her know.

## 2016-02-19 NOTE — Telephone Encounter (Signed)
Patient has been advised. KW 

## 2016-02-19 NOTE — Telephone Encounter (Signed)
Have sent new rx for amoxicillin to [facility], should also take with OTC pro-biotics.

## 2016-02-25 ENCOUNTER — Ambulatory Visit: Payer: BC Managed Care – PPO | Admitting: Physician Assistant

## 2016-03-17 ENCOUNTER — Other Ambulatory Visit: Payer: Self-pay

## 2016-03-17 DIAGNOSIS — I10 Essential (primary) hypertension: Secondary | ICD-10-CM

## 2016-03-17 MED ORDER — VALSARTAN-HYDROCHLOROTHIAZIDE 160-12.5 MG PO TABS: ORAL_TABLET | ORAL | 1 refills | Status: DC

## 2016-03-17 NOTE — Telephone Encounter (Signed)
[facility] is requesting medication refill for the following medication.  Valsartan-HCTZ 160-12.5 MG tab  QTY:180 R:1

## 2016-03-20 ENCOUNTER — Ambulatory Visit (INDEPENDENT_AMBULATORY_CARE_PROVIDER_SITE_OTHER): Payer: BC Managed Care – PPO | Admitting: Podiatry

## 2016-03-20 DIAGNOSIS — L600 Ingrowing nail: Secondary | ICD-10-CM | POA: Diagnosis not present

## 2016-03-20 NOTE — Progress Notes (Signed)
She presents today with chief complaint of ingrown toenail to the second digit of the right foot 1-1/2 weeks she states it is very painful and has been draining. She tried cutting it herself but she's been soaking in Epsom salts and warm water she states that I think I got it infected on my own.  Objective: Vital signs are stable alert and oriented 3 pulses are palpable. Sharp incurvated nail margin along the tibial border of the second digit of the right foot indicative of ingrown nail is also granulation tissue. Once a malodor.  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  Plan: Performed a chemical matrixectomy today after local anesthesia was administered. She tolerated procedure well without complications. She was provided with both oral and written home going instructions for care and soaking of her toe will follow up with me in 2 weeks

## 2016-03-20 NOTE — Patient Instructions (Signed)

## 2016-03-25 ENCOUNTER — Ambulatory Visit (INDEPENDENT_AMBULATORY_CARE_PROVIDER_SITE_OTHER): Payer: BC Managed Care – PPO | Admitting: Family Medicine

## 2016-03-25 VITALS — BP 116/62 | HR 90 | Temp 98.8°F | Resp 14 | Wt 236.0 lb

## 2016-03-25 DIAGNOSIS — J0100 Acute maxillary sinusitis, unspecified: Secondary | ICD-10-CM

## 2016-03-25 MED ORDER — AMOXICILLIN 500 MG PO CAPS: 1000.0000 mg | ORAL_CAPSULE | Freq: Three times a day (TID) | ORAL | 0 refills | Status: AC

## 2016-03-25 NOTE — Progress Notes (Signed)
Patient: Erin Frye Female    DOB: 02/11/1970   46 y.o.   MRN: [id number] Visit Date: 03/25/2016  Today's Provider: Mila Merryonald Fisher, MD   Chief Complaint  Patient presents with  . Sinus Problem   Subjective:    HPI  Patient states she started not feeling well last Saturday-03/18/16. Started as a cold and now settles in her "head". Sinus pressure, ears stopped up, post nasal drip, sneezing, fatigues. No fever. She has used Mucinex and Advil cold and sinus. She feels like she has sinus infection again. She was seen by Dortha Kernennis Chrismon, PA on 02/18/16 and was diagnosed with maxillary sinusitis. She was started on Augmentin at that time but could not tolerate that and was given Amoxicillin instead and that helped and symptoms resolved. She does takes [employer] adviserAllegra and uses Flonase on regular bases. States she caught another cold last weekend, which mostly resolved, but sinuses flared up     Allergies  Allergen Reactions  . Codeine     itching     Current Outpatient Prescriptions:  .  ferrous sulfate 325 (65 FE) MG tablet, Take by mouth., Disp: , Rfl:  .  fexofenadine (ALLEGRA) 180 MG tablet, Take by mouth., Disp: , Rfl:  .  fluticasone (FLONASE ALLERGY RELIEF) 50 MCG/ACT nasal spray, Place into the nose., Disp: , Rfl:  .  MULTIPLE VITAMIN PO, Take by mouth., Disp: , Rfl:  .  simvastatin (ZOCOR) 20 MG tablet, take 1 tablet by mouth at bedtime, Disp: 90 tablet, Rfl: 1 .  valsartan-hydrochlorothiazide (DIOVAN-HCT) 160-12.5 MG tablet, TAKE 1 TABLET TWICE A DAY. (WARNING: NOT TO TAKE IF   PREGNANT OR TRYING TO GET  PREGNANT), Disp: 180 tablet, Rfl: 1  Review of Systems  Constitutional: Positive for activity change and fatigue. Negative for appetite change, chills and fever.  HENT: Positive for congestion, ear pain (more of a pressure), nosebleeds, postnasal drip, rhinorrhea, sinus pain, sinus pressure and sneezing. Negative for facial swelling, hearing loss, mouth sores, sore  throat, tinnitus, trouble swallowing and voice change.   Respiratory: Positive for cough. Negative for chest tightness, shortness of breath and wheezing.   Cardiovascular: Negative.   Musculoskeletal: Negative.   Neurological: Positive for headaches. Negative for dizziness and light-headedness.    Social History  Substance Use Topics  . Smoking status: Never Smoker  . Smokeless tobacco: Never Used  . Alcohol use 0.0 oz/week     Comment: rare   Objective:   BP 116/62   Pulse 90   Temp 98.8 F (37.1 C)   Resp 14   Wt 236 lb (107 kg)   BMI 41.81 kg/m  Vitals:   03/25/16 0951  BP: 116/62  Pulse: 90  Resp: 14  Temp: 98.8 F (37.1 C)  Weight: 236 lb (107 kg)     Physical Exam  General Appearance:    Alert, cooperative, no distress  HENT:   bilateral TM normal without fluid or infection, neck without nodes, throat normal without erythema or exudate and maxillary sinus tender  Eyes:    PERRL, conjunctiva/corneas clear, EOM's intact       Lungs:     Clear to auscultation bilaterally, respirations unlabored  Heart:    Regular rate and rhythm  Neurologic:   Awake, alert, oriented x 3. No apparent focal neurological           defect.           Assessment & Plan:  1. Acute maxillary sinusitis, recurrence not specified  - amoxicillin (AMOXIL) 500 MG capsule; Take 2 capsules (1,000 mg total) by mouth 3 (three) times daily.  Dispense: 30 capsule; Refill: 0  Call if symptoms change or if not rapidly improving.          Mila Merry, MD  [facility]

## 2016-04-05 ENCOUNTER — Ambulatory Visit (INDEPENDENT_AMBULATORY_CARE_PROVIDER_SITE_OTHER): Payer: BC Managed Care – PPO | Admitting: Podiatry

## 2016-04-05 ENCOUNTER — Encounter: Payer: Self-pay | Admitting: Podiatry

## 2016-04-05 DIAGNOSIS — L600 Ingrowing nail: Secondary | ICD-10-CM

## 2016-04-05 NOTE — Patient Instructions (Signed)

## 2016-04-06 NOTE — Progress Notes (Signed)
She presents today for follow-up of matrixectomy tibial border of the second digit of the right foot. She states it is still oozing a little bit but all in all it feels much better than it did prior to surgery.  Objective: Vital signs are stable she is alert and oriented 3. Pulses are palpable. Neurologic sensorium is intact. Deep tendon reflexes are intact. No erythema edema cellulitis drainage or odor that I can see today along the tibial border of the second digit of the right foot. It appears to be healing quite nicely after her matrixectomy.  Assessment: Healing matrixectomy right foot.  Plan: Continue to soak for the next week or so Epsom salts and warm water covered during the daytime and leave open at bedtime.

## 2016-04-10 ENCOUNTER — Encounter: Payer: Self-pay | Admitting: Physician Assistant

## 2016-04-10 ENCOUNTER — Ambulatory Visit (INDEPENDENT_AMBULATORY_CARE_PROVIDER_SITE_OTHER): Payer: BC Managed Care – PPO | Admitting: Physician Assistant

## 2016-04-10 VITALS — BP 120/82 | HR 88 | Temp 97.8°F | Resp 16 | Wt 237.0 lb

## 2016-04-10 DIAGNOSIS — R7309 Other abnormal glucose: Secondary | ICD-10-CM

## 2016-04-10 DIAGNOSIS — E782 Mixed hyperlipidemia: Secondary | ICD-10-CM | POA: Diagnosis not present

## 2016-04-10 DIAGNOSIS — I10 Essential (primary) hypertension: Secondary | ICD-10-CM

## 2016-04-10 LAB — POCT GLYCOSYLATED HEMOGLOBIN (HGB A1C)
ESTIMATED AVERAGE GLUCOSE: 126
HEMOGLOBIN A1C: 6

## 2016-04-10 NOTE — Progress Notes (Signed)
Patient: Erin Frye Female    DOB: [birth year]   46 y.o.   MRN: [id number] Visit Date: 04/10/2016  Today's Provider: Margaretann Loveless, PA-C   Chief Complaint  Patient presents with  . Diabetes  . Hypertension  . Hyperlipidemia   Subjective:    HPI  Prediabetes, Follow-up:   Lab Results  Component Value Date   HGBA1C 6.0 04/10/2016   HGBA1C 5.9 (H) 08/12/2015   GLUCOSE 98 08/12/2015   Last seen for for this 6 months ago.  Management since that visit includes no changes. Current symptoms include none and have been stable.  Weight trend: stable Prior visit with dietician: no Current diet: in general, a "healthy" diet   Current exercise: aerobics and walking  Pertinent Labs:    Component Value Date/Time   CHOL 332 (H) 08/12/2015 0903   TRIG 523 (H) 08/12/2015 0903   CHOLHDL 6.6 (H) 08/12/2015 0903   CREATININE 0.69 08/12/2015 0903   Wt Readings from Last 3 Encounters:  04/10/16 237 lb (107.5 kg)  03/25/16 236 lb (107 kg)  02/18/16 236 lb 3.2 oz (107.1 kg)    Hypertension, follow-up:  BP Readings from Last 3 Encounters:  04/10/16 120/82  03/25/16 116/62  02/18/16 128/88   She was last seen for hypertension 6 months ago.  BP at that visit was 119/78. Management since that visit includes check labs.She reports excellent compliance with treatment. She is not having side effects.  She is not exercising. She is adherent to low salt diet.   Outside blood pressures are not being taken. She is experiencing none.  Patient denies chest pain, chest pressure/discomfort, claudication, exertional chest pressure/discomfort, fatigue, irregular heart beat, lower extremity edema, near-syncope, orthopnea, palpitations and paroxysmal nocturnal dyspnea.   Cardiovascular risk factors include diabetes mellitus, dyslipidemia, family history of premature cardiovascular disease, hypertension and sedentary lifestyle.  Use of agents associated with hypertension:  none.  ------------------------------------------------------------------------  Lipid/Cholesterol, Follow-up:   Last seen for this 6 months ago.  Management since that visit includes check labs.  Last Lipid Panel:    Component Value Date/Time   CHOL 332 (H) 08/12/2015 0903   TRIG 523 (H) 08/12/2015 0903   HDL 50 08/12/2015 0903   CHOLHDL 6.6 (H) 08/12/2015 0903   LDLCALC Comment 08/12/2015 0903   She reports excellent compliance with treatment. She is not having side effects.   Wt Readings from Last 3 Encounters:  04/10/16 237 lb (107.5 kg)  03/25/16 236 lb (107 kg)  02/18/16 236 lb 3.2 oz (107.1 kg)  ------------------------------------------------------------------------    Allergies  Allergen Reactions  . Codeine     itching     Current Outpatient Prescriptions:  .  ferrous sulfate 325 (65 FE) MG tablet, Take by mouth., Disp: , Rfl:  .  fexofenadine (ALLEGRA) 180 MG tablet, Take by mouth., Disp: , Rfl:  .  fluticasone (FLONASE ALLERGY RELIEF) 50 MCG/ACT nasal spray, Place into the nose., Disp: , Rfl:  .  MULTIPLE VITAMIN PO, Take by mouth., Disp: , Rfl:  .  simvastatin (ZOCOR) 20 MG tablet, take 1 tablet by mouth at bedtime, Disp: 90 tablet, Rfl: 1 .  valsartan-hydrochlorothiazide (DIOVAN-HCT) 160-12.5 MG tablet, TAKE 1 TABLET TWICE A DAY. (WARNING: NOT TO TAKE IF   PREGNANT OR TRYING TO GET  PREGNANT), Disp: 180 tablet, Rfl: 1  Review of Systems  Constitutional: Negative.   HENT: Positive for congestion and postnasal drip.   Respiratory: Positive for cough.  Cardiovascular: Negative.   Allergic/Immunologic: Positive for environmental allergies.    Social History  Substance Use Topics  . Smoking status: Never Smoker  . Smokeless tobacco: Never Used  . Alcohol use 0.0 oz/week     Comment: rare   Objective:   BP 120/82 (BP Location: Left Arm, Patient Position: Sitting, Cuff Size: Large)   Pulse 88   Temp 97.8 F (36.6 C) (Oral)   Resp 16   Wt 237 lb  (107.5 kg)   LMP 04/02/2016 (Exact Date)   SpO2 97%   BMI 41.98 kg/m  Vitals:   04/10/16 0814  BP: 120/82  Pulse: 88  Resp: 16  Temp: 97.8 F (36.6 C)  TempSrc: Oral  SpO2: 97%  Weight: 237 lb (107.5 kg)    Physical Exam  Constitutional: She appears well-developed and well-nourished. No distress.  Neck: Normal range of motion. Neck supple. No JVD present. No tracheal deviation present. No thyromegaly present.  Cardiovascular: Normal rate, regular rhythm and normal heart sounds.  Exam reveals no gallop and no friction rub.   No murmur heard. Pulmonary/Chest: Effort normal and breath sounds normal. No respiratory distress. She has no wheezes. She has no rales.  Musculoskeletal: She exhibits no edema.  Lymphadenopathy:    She has no cervical adenopathy.  Skin: She is not diaphoretic.  Vitals reviewed.     Assessment & Plan:     1. Elevated hemoglobin A1c A1c increased to 6.0. Discussed limiting carbohydrates and how to read nutrition labels to find carbohydrates. She is also planning to start back at the gym as well. We will recheck again in 6 months at her CPE.  - POCT glycosylated hemoglobin (Hb A1C)  2. Essential hypertension Well controlled on valsartan-hctz. Will check labs as below and f/u pending results. - Comprehensive Metabolic Panel (CMET)  3. Hypercholesterolemia with hypertriglyceridemia Has been on simvastatin  x 6 months. Will check labs as below and f/u pending results. - Lipid Profile - Comprehensive Metabolic Panel (CMET)       Margaretann Loveless, PA-C  [facility]

## 2016-04-10 NOTE — Patient Instructions (Signed)
Carbohydrate Counting for Diabetes Mellitus, Adult Carbohydrate counting is a method for keeping track of how many carbohydrates you eat. Eating carbohydrates naturally increases the amount of sugar (glucose) in the blood. Counting how many carbohydrates you eat helps keep your blood glucose within normal limits, which helps you manage your diabetes (diabetes mellitus). It is important to know how many carbohydrates you can safely have in each meal. This is different for every person. A diet and nutrition specialist (registered dietitian) can help you make a meal plan and calculate how many carbohydrates you should have at each meal and snack. Carbohydrates are found in the following foods:  Grains, such as breads and cereals.  Dried beans and soy products.  Starchy vegetables, such as potatoes, peas, and corn.  Fruit and fruit juices.  Milk and yogurt.  Sweets and snack foods, such as cake, cookies, candy, chips, and soft drinks. How do I count carbohydrates? There are two ways to count carbohydrates in food. You can use either of the methods or a combination of both. Reading "Nutrition Facts" on packaged food  The "Nutrition Facts" list is included on the labels of almost all packaged foods and beverages in the [place] It includes:  The serving size.  Information about nutrients in each serving, including the grams (g) of carbohydrate per serving. To use the "Nutrition Facts":  Decide how many servings you will have.  Multiply the number of servings by the number of carbohydrates per serving.  The resulting number is the total amount of carbohydrates that you will be having. Learning standard serving sizes of other foods  When you eat foods containing carbohydrates that are not packaged or do not include "Nutrition Facts" on the label, you need to measure the servings in order to count the amount of carbohydrates:  Measure the foods that you will eat with a food scale or measuring  cup, if needed.  Decide how many standard-size servings you will eat.  Multiply the number of servings by 15. Most carbohydrate-rich foods have about 15 g of carbohydrates per serving.  For example, if you eat 8 oz (170 g) of strawberries, you will have eaten 2 servings and 30 g of carbohydrates (2 servings x 15 g = 30 g).  For foods that have more than one food mixed, such as soups and casseroles, you must count the carbohydrates in each food that is included. The following list contains standard serving sizes of common carbohydrate-rich foods. Each of these servings has about 15 g of carbohydrates:   hamburger bun or  English muffin.   oz (15 mL) syrup.   oz (14 g) jelly.  1 slice of bread.  1 six-inch tortilla.  3 oz (85 g) cooked rice or pasta.  4 oz (113 g) cooked dried beans.  4 oz (113 g) starchy vegetable, such as peas, corn, or potatoes.  4 oz (113 g) hot cereal.  4 oz (113 g) mashed potatoes or  of a large baked potato.  4 oz (113 g) canned or frozen fruit.  4 oz (120 mL) fruit juice.  4-6 crackers.  6 chicken nuggets.  6 oz (170 g) unsweetened dry cereal.  6 oz (170 g) plain fat-free yogurt or yogurt sweetened with artificial sweeteners.  8 oz (240 mL) milk.  8 oz (170 g) fresh fruit or one small piece of fruit.  24 oz (680 g) popped popcorn. Example of carbohydrate counting Sample meal  3 oz (85 g) chicken breast.  6 oz (  170 g) brown rice.  4 oz (113 g) corn.  8 oz (240 mL) milk.  8 oz (170 g) strawberries with sugar-free whipped topping. Carbohydrate calculation 1. Identify the foods that contain carbohydrates:  Rice.  Corn.  Milk.  Strawberries. 2. Calculate how many servings you have of each food:  2 servings rice.  1 serving corn.  1 serving milk.  1 serving strawberries. 3. Multiply each number of servings by 15 g:  2 servings rice x 15 g = 30 g.  1 serving corn x 15 g = 15 g.  1 serving milk x 15 g = 15  g.  1 serving strawberries x 15 g = 15 g. 4. Add together all of the amounts to find the total grams of carbohydrates eaten:  30 g + 15 g + 15 g + 15 g = 75 g of carbohydrates total. This information is not intended to replace advice given to you by your health care provider. Make sure you discuss any questions you have with your health care provider. Document Released: 12/26/2004 Document Revised: 07/16/2015 Document Reviewed: 06/09/2015 Elsevier Interactive Patient Education  2017 Elsevier Inc.  

## 2016-04-11 LAB — COMPREHENSIVE METABOLIC PANEL
ALK PHOS: 83 IU/L (ref 39–117)
ALT: 14 IU/L (ref 0–32)
AST: 19 IU/L (ref 0–40)
Albumin/Globulin Ratio: 1.5 (ref 1.2–2.2)
Albumin: 4.4 g/dL (ref 3.5–5.5)
BILIRUBIN TOTAL: 0.4 mg/dL (ref 0.0–1.2)
BUN / CREAT RATIO: 18 (ref 9–23)
BUN: 12 mg/dL (ref 6–24)
CHLORIDE: 96 mmol/L (ref 96–106)
CO2: 27 mmol/L (ref 18–29)
Calcium: 9.5 mg/dL (ref 8.7–10.2)
Creatinine, Ser: 0.68 mg/dL (ref 0.57–1.00)
GFR calc non Af Amer: 105 mL/min/{1.73_m2} (ref >59)
GFR, EST AFRICAN AMERICAN: 120 mL/min/{1.73_m2} (ref >59)
GLOBULIN, TOTAL: 2.9 g/dL (ref 1.5–4.5)
GLUCOSE: 114 mg/dL — AB (ref 65–99)
Potassium: 3.7 mmol/L (ref 3.5–5.2)
SODIUM: 140 mmol/L (ref 134–144)
TOTAL PROTEIN: 7.3 g/dL (ref 6.0–8.5)

## 2016-04-11 LAB — LIPID PANEL
CHOLESTEROL TOTAL: 180 mg/dL (ref 100–199)
Chol/HDL Ratio: 3.7 ratio (ref 0.0–4.4)
HDL: 49 mg/dL (ref >39)
LDL Calculated: 54 mg/dL (ref 0–99)
TRIGLYCERIDES: 384 mg/dL — AB (ref 0–149)
VLDL CHOLESTEROL CAL: 77 mg/dL — AB (ref 5–40)

## 2016-07-25 ENCOUNTER — Telehealth: Payer: Self-pay | Admitting: Physician Assistant

## 2016-07-25 NOTE — Telephone Encounter (Signed)
Pt called concerned about her blood pressure meds being recall.  [facility] confirmed that I has been recalled and told her call her provider.  Pt's call back is [phone]  Thanks teri

## 2016-07-25 NOTE — Telephone Encounter (Signed)
Please schedule patient for change.

## 2016-07-25 NOTE — Telephone Encounter (Signed)
OV scheduled for tomorrow.  

## 2016-07-26 ENCOUNTER — Ambulatory Visit (INDEPENDENT_AMBULATORY_CARE_PROVIDER_SITE_OTHER): Payer: BC Managed Care – PPO | Admitting: Physician Assistant

## 2016-07-26 ENCOUNTER — Encounter: Payer: Self-pay | Admitting: Physician Assistant

## 2016-07-26 VITALS — BP 130/82 | HR 97 | Temp 98.1°F | Resp 16 | Wt 235.4 lb

## 2016-07-26 DIAGNOSIS — I10 Essential (primary) hypertension: Secondary | ICD-10-CM

## 2016-07-26 MED ORDER — LOSARTAN POTASSIUM-HCTZ 100-25 MG PO TABS: 1.0000 | ORAL_TABLET | Freq: Every day | ORAL | 1 refills | Status: DC

## 2016-07-26 NOTE — Progress Notes (Signed)
       Patient: Erin Frye Female    DOB: 10/22/1970   46 y.o.   MRN: [id number] Visit Date: 07/26/2016  Today's Provider: Margaretann LovelessJennifer M Arcenia Scarbro, PA-C   Chief Complaint  Patient presents with  . 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 She reports her BP has been stable for years with this medication and she is here to see what the provider advises.    Allergies  Allergen Reactions  . Codeine     itching     Current Outpatient Prescriptions:  .  ferrous sulfate 325 (65 FE) MG tablet, Take by mouth., Disp: , Rfl:  .  fexofenadine (ALLEGRA) 180 MG tablet, Take by mouth., Disp: , Rfl:  .  fluticasone (FLONASE ALLERGY RELIEF) 50 MCG/ACT nasal spray, Place into the nose., Disp: , Rfl:  .  MULTIPLE VITAMIN PO, Take by mouth., Disp: , Rfl:  .  simvastatin (ZOCOR) 20 MG tablet, take 1 tablet by mouth at bedtime, Disp: 90 tablet, Rfl: 1 .  valsartan-hydrochlorothiazide (DIOVAN-HCT) 160-12.5 MG tablet, TAKE 1 TABLET TWICE A DAY. (WARNING: NOT TO TAKE IF   PREGNANT OR TRYING TO GET  PREGNANT) (Patient not taking: Reported on 07/26/2016), Disp: 180 tablet, Rfl: 1  Review of Systems  Constitutional: Negative for fatigue.  Respiratory: Negative for cough, chest tightness and shortness of breath.   Cardiovascular: Negative for chest pain, palpitations and leg swelling.  Neurological: Negative for dizziness, light-headedness and headaches.    Social History  Substance Use Topics  . Smoking status: Never Smoker  . Smokeless tobacco: Never Used  . Alcohol use 0.0 oz/week     Comment: rare   Objective:   BP 130/82 (BP Location: Left Arm, Patient Position: Sitting, Cuff Size: Normal)   Pulse 97   Temp 98.1 F (36.7 C) (Oral)   Resp 16   Wt 235 lb 6.4 oz (106.8 kg)   BMI 41.70 kg/m  Vitals:   07/26/16 0859  BP: 130/82  Pulse:  97  Resp: 16  Temp: 98.1 F (36.7 C)  TempSrc: Oral  Weight: 235 lb 6.4 oz (106.8 kg)     Physical Exam  Constitutional: She appears well-developed and well-nourished. No distress.  Neck: Normal range of motion. Neck supple.  Cardiovascular: Normal rate, regular rhythm and normal heart sounds.  Exam reveals no gallop and no friction rub.   No murmur heard. Pulmonary/Chest: Effort normal and breath sounds normal. No respiratory distress. She has no wheezes. She has no rales.  Skin: She is not diaphoretic.  Vitals reviewed.       Assessment & Plan:     1. Essential hypertension Will change from Valsartan-HCTZ to losartan-HCTZ as below. She is to monitor BP at home. She has failed Benicar in the past due to side effects. I will see her back in 4 weeks to see how she is tolerating the medication and to recheck BP.  - losartan-hydrochlorothiazide (HYZAAR) 100-25 MG tablet; Take 1 tablet by mouth daily.  Dispense: 30 tablet; Refill: 1       Margaretann LovelessJennifer M Lylianna Fraiser, PA-C  [facility]

## 2016-07-26 NOTE — Patient Instructions (Signed)
Hydrochlorothiazide, HCTZ; Losartan tablets What is this medicine? LOSARTAN; HYDROCHLOROTHIAZIDE (loe SAR tan; hye droe klor oh THYE a zide) is a combination of a drug that relaxes blood vessels and a diuretic. It is used to treat high blood pressure. This medicine may also reduce the risk of stroke in certain patients. This medicine may be used for other purposes; ask your health care provider or pharmacist if you have questions. COMMON BRAND NAME(S): Hyzaar What should I tell my health care provider before I take this medicine? They need to know if you have any of these conditions: -decreased urine -kidney disease -liver disease -if you are on a special diet, like a low-salt diet -immune system problems, like lupus -an unusual or allergic reaction to losartan, hydrochlorothiazide, sulfa drugs, other medicines, foods, dyes, or preservatives -pregnant or trying to get pregnant -breast-feeding How should I use this medicine? Take this medicine by mouth with a glass of water. Follow the directions on the prescription label. You can take it with or without food. If it upsets your stomach, take it with food. Take your medicine at regular intervals. Do not take it more often than directed. Do not stop taking except on your doctor's advice. Talk to your pediatrician regarding the use of this medicine in children. Special care may be needed. Overdosage: If you think you have taken too much of this medicine contact a poison control center or emergency room at once. NOTE: This medicine is only for you. Do not share this medicine with others. What if I miss a dose? If you miss a dose, take it as soon as you can. If it is almost time for your next dose, take only that dose. Do not take double or extra doses. What may interact with this medicine? -barbiturates, like phenobarbital -blood pressure medicines -celecoxib -cimetidine -corticosteroids -diabetic medicines -diuretics, especially triamterene,  spironolactone or amiloride -fluconazole -lithium -NSAIDs, medicines for pain and inflammation, like ibuprofen or naproxen -potassium salts or potassium supplements -prescription pain medicines -rifampin -skeletal muscle relaxants like tubocurarine -some cholesterol-lowering medicines like cholestyramine or colestipol This list may not describe all possible interactions. Give your health care provider a list of all the medicines, herbs, non-prescription drugs, or dietary supplements you use. Also tell them if you smoke, drink alcohol, or use illegal drugs. Some items may interact with your medicine. What should I watch for while using this medicine? Check your blood pressure regularly while you are taking this medicine. Ask your doctor or health care professional what your blood pressure should be and when you should contact him or her. When you check your blood pressure, write down the measurements to show your doctor or health care professional. If you are taking this medicine for a long time, you must visit your health care professional for regular checks on your progress. Make sure you schedule appointments on a regular basis. You must not get dehydrated. Ask your doctor or health care professional how much fluid you need to drink a day. Check with him or her if you get an attack of severe diarrhea, nausea and vomiting, or if you sweat a lot. The loss of too much body fluid can make it dangerous for you to take this medicine. Women should inform their doctor if they wish to become pregnant or think they might be pregnant. There is a potential for serious side effects to an unborn child, particularly in the second or third trimester. Talk to your health care professional or pharmacist for more information.   You may get drowsy or dizzy. Do not drive, use machinery, or do anything that needs mental alertness until you know how this drug affects you. Do not stand or sit up quickly, especially if you are  an older patient. This reduces the risk of dizzy or fainting spells. Alcohol can make you more drowsy and dizzy. Avoid alcoholic drinks. This medicine may affect your blood sugar level. If you have diabetes, check with your doctor or health care professional before changing the dose of your diabetic medicine. Avoid salt substitutes unless you are told otherwise by your doctor or health care professional. Do not treat yourself for coughs, colds, or pain while you are taking this medicine without asking your doctor or health care professional for advice. Some ingredients may increase your blood pressure. What side effects may I notice from receiving this medicine? Side effects that you should report to your doctor or health care professional as soon as possible: -allergic reactions like skin rash, itching or hives, swelling of the face, lips, or tongue -breathing problems -changes in vision -dark urine -eye pain -fast or irregular heart beat, palpitations, or chest pain -feeling faint or lightheaded -muscle cramps -persistent dry cough -redness, blistering, peeling or loosening of the skin, including inside the mouth -stomach pain -trouble passing urine or change in the amount of urine -unusual bleeding or bruising -worsened gout pain -yellowing of the eyes or skin Side effects that usually do not require medical attention (report to your doctor or health care professional if they continue or are bothersome): -change in sex drive or performance -headache This list may not describe all possible side effects. Call your doctor for medical advice about side effects. You may report side effects to FDA at 1-800-FDA-1088. Where should I keep my medicine? Keep out of the reach of children. Store at room temperature between 15 and 30 degrees C (59 and 86 degrees F). Protect from light. Keep container tightly closed. Throw away any unused medicine after the expiration date. NOTE: This sheet is a  summary. It may not cover all possible information. If you have questions about this medicine, talk to your doctor, pharmacist, or health care provider.  2018 Elsevier/Gold Standard (2009-09-15 13:57:32)  

## 2016-08-04 ENCOUNTER — Telehealth: Payer: Self-pay | Admitting: Physician Assistant

## 2016-08-04 DIAGNOSIS — I10 Essential (primary) hypertension: Secondary | ICD-10-CM

## 2016-08-04 MED ORDER — LOSARTAN POTASSIUM-HCTZ 100-25 MG PO TABS: 1.0000 | ORAL_TABLET | Freq: Every day | ORAL | 1 refills | Status: DC

## 2016-08-04 NOTE — Telephone Encounter (Signed)
Left patient a detailed message on cell voicemail advising her that readings were good and Rx has been sent to pharmacy.

## 2016-08-04 NOTE — Telephone Encounter (Signed)
That is perfect! If she is tolerating well she can continue the losartan-HCTZ. I will send in refills.

## 2016-08-04 NOTE — Telephone Encounter (Signed)
Pt called to advise that her blood pressure is running 120/85.  XL#[phone]/DGCB#[phone]/MW

## 2016-08-11 ENCOUNTER — Other Ambulatory Visit: Payer: Self-pay | Admitting: Physician Assistant

## 2016-08-11 DIAGNOSIS — E782 Mixed hyperlipidemia: Secondary | ICD-10-CM

## 2016-08-23 ENCOUNTER — Ambulatory Visit: Payer: Self-pay | Admitting: Physician Assistant

## 2016-08-30 ENCOUNTER — Ambulatory Visit: Payer: Self-pay | Admitting: Physician Assistant

## 2016-11-24 ENCOUNTER — Ambulatory Visit: Payer: BC Managed Care – PPO | Admitting: Physician Assistant

## 2016-11-24 ENCOUNTER — Encounter: Payer: Self-pay | Admitting: Physician Assistant

## 2016-11-24 VITALS — BP 138/74 | HR 80 | Temp 97.8°F | Resp 16 | Wt 236.0 lb

## 2016-11-24 DIAGNOSIS — L0293 Carbuncle, unspecified: Secondary | ICD-10-CM | POA: Diagnosis not present

## 2016-11-24 MED ORDER — DOXYCYCLINE HYCLATE 100 MG PO TABS: 100.0000 mg | ORAL_TABLET | Freq: Two times a day (BID) | ORAL | 0 refills | Status: AC

## 2016-11-24 NOTE — Patient Instructions (Signed)
3g tylenol a day max 400 mg ibuprofen every 8 hours  Skin Abscess A skin abscess is an infected area on or under your skin that contains pus and other material. An abscess can happen almost anywhere on your body. Some abscesses break open (rupture) on their own. Most continue to get worse unless they are treated. The infection can spread deeper into the body and into your blood, which can make you feel sick. Treatment usually involves draining the abscess. Follow these instructions at home: Abscess Care  If you have an abscess that has not drained, place a warm, clean, wet washcloth over the abscess several times a day. Do this as told by your doctor.  Follow instructions from your doctor about how to take care of your abscess. Make sure you: ? Cover the abscess with a bandage (dressing). ? Change your bandage or gauze as told by your doctor. ? Wash your hands with soap and water before you change the bandage or gauze. If you cannot use soap and water, use hand sanitizer.  Check your abscess every day for signs that the infection is getting worse. Check for: ? More redness, swelling, or pain. ? More fluid or blood. ? Warmth. ? More pus or a bad smell. Medicines   Take over-the-counter and prescription medicines only as told by your doctor.  If you were prescribed an antibiotic medicine, take it as told by your doctor. Do not stop taking the antibiotic even if you start to feel better. General instructions  To avoid spreading the infection: ? Do not share personal care items, towels, or hot tubs with others. ? Avoid making skin-to-skin contact with other people.  Keep all follow-up visits as told by your doctor. This is important. Contact a doctor if:  You have more redness, swelling, or pain around your abscess.  You have more fluid or blood coming from your abscess.  Your abscess feels warm when you touch it.  You have more pus or a bad smell coming from your abscess.  You  have a fever.  Your muscles ache.  You have chills.  You feel sick. Get help right away if:  You have very bad (severe) pain.  You see red streaks on your skin spreading away from the abscess. This information is not intended to replace advice given to you by your health care provider. Make sure you discuss any questions you have with your health care provider. Document Released: 06/14/2007 Document Revised: 08/22/2015 Document Reviewed: 11/04/2014 Elsevier Interactive Patient Education  [employer]2018 Elsevier Inc.

## 2016-11-24 NOTE — Progress Notes (Signed)
       Patient: Erin Frye Female    DOB: 05/19/1970   46 y.o.   MRN: [id number] Visit Date: 11/24/2016  Today's Provider: Trey SailorsAdriana M Pollak, PA-C   No chief complaint on file.  Subjective:    HPI   Abscess: Erin Frye is a 46 y/o woman Patient presents for evaluation of a cutaneous abscess. Lesion is located in the chin. Onset was 2 days ago. Symptoms have gradually worsened. Abscess has associated symptoms of pain. Patient does have previous history of cutaneous abscesses. Patient does not have diabetes. She took three doses of her son's left over amoxicillin without relief. Denies fevers, chills, nausea, vomiting. Denies vomiting.        Allergies  Allergen Reactions  . Codeine     itching     Current Outpatient Medications:  .  ferrous sulfate 325 (65 FE) MG tablet, Take by mouth., Disp: , Rfl:  .  fexofenadine (ALLEGRA) 180 MG tablet, Take by mouth., Disp: , Rfl:  .  fluticasone (FLONASE ALLERGY RELIEF) 50 MCG/ACT nasal spray, Place into the nose., Disp: , Rfl:  .  losartan-hydrochlorothiazide (HYZAAR) 100-25 MG tablet, Take 1 tablet by mouth daily., Disp: 90 tablet, Rfl: 1 .  MULTIPLE VITAMIN PO, Take by mouth., Disp: , Rfl:  .  simvastatin (ZOCOR) 20 MG tablet, TAKE 1 TABLET BY MOUTH EVERY EVENING, Disp: 90 tablet, Rfl: 1  Review of Systems  Constitutional: Negative.   Skin: Negative for color change, pallor, rash and wound.    Social History   Tobacco Use  . Smoking status: Never Smoker  . Smokeless tobacco: Never Used  Substance Use Topics  . Alcohol use: Yes    Alcohol/week: 0.0 oz    Comment: rare   Objective:   BP 138/74 (BP Location: Right Arm, Patient Position: Sitting, Cuff Size: Large)   Pulse 80   Temp 97.8 F (36.6 C) (Oral)   Resp 16   Wt 236 lb (107 kg)   BMI 41.81 kg/m  Vitals:   11/24/16 1636  BP: 138/74  Pulse: 80  Resp: 16  Temp: 97.8 F (36.6 C)  TempSrc: Oral  Weight: 236 lb (107 kg)      Physical Exam  Constitutional: She appears well-developed and well-nourished.  Skin: Skin is warm and dry. There is erythema.           Assessment & Plan:     1. Carbuncle  Unable to culture 2/2 no drainage. Will give doxycycline. Counseled on not using other people's antibiotics.   - doxycycline (VIBRA-TABS) 100 MG tablet; Take 1 tablet (100 mg total) 2 (two) times daily for 7 days by mouth.  Dispense: 14 tablet; Refill: 0  The entirety of the information documented in the History of Present Illness, Review of Systems and Physical Exam were personally obtained by me. Portions of this information were initially documented by Kavin LeechLaura Walsh, CMA and reviewed by me for thoroughness and accuracy.   Return if symptoms worsen or fail to improve.        Trey SailorsAdriana M Pollak, PA-C  [facility]

## 2017-02-03 ENCOUNTER — Other Ambulatory Visit: Payer: Self-pay | Admitting: Physician Assistant

## 2017-02-03 DIAGNOSIS — E782 Mixed hyperlipidemia: Secondary | ICD-10-CM

## 2017-02-05 NOTE — Telephone Encounter (Signed)
Last ov 11/24/16  Last filled 08/14/16 Please review. Thank you. sd

## 2017-02-07 ENCOUNTER — Encounter: Payer: Self-pay | Admitting: Podiatry

## 2017-02-07 ENCOUNTER — Ambulatory Visit: Payer: BC Managed Care – PPO | Admitting: Podiatry

## 2017-02-07 ENCOUNTER — Ambulatory Visit: Payer: BC Managed Care – PPO

## 2017-02-07 DIAGNOSIS — M7750 Other enthesopathy of unspecified foot: Secondary | ICD-10-CM

## 2017-02-07 DIAGNOSIS — M7751 Other enthesopathy of right foot: Secondary | ICD-10-CM

## 2017-02-07 DIAGNOSIS — M779 Enthesopathy, unspecified: Principal | ICD-10-CM

## 2017-02-07 DIAGNOSIS — M778 Other enthesopathies, not elsewhere classified: Secondary | ICD-10-CM

## 2017-02-07 MED ORDER — METHYLPREDNISOLONE 4 MG PO TBPK: ORAL_TABLET | ORAL | 0 refills | Status: DC

## 2017-02-07 NOTE — Progress Notes (Signed)
She presents today after having not been seen for quite some time with chief complaint of pain to her forefoot which is radiated to her anterior lateral foot and back toward the subtalar joint.  Also complaining of some heel pain particularly in the mornings.  Objective: Vital signs are stable she is alert and oriented x3 does not have severe pain on palpation medial calcaneal tubercle but she does have pain on palpation and end range of motion of the second metatarsal phalangeal joint.  Previously we had diagnosed her with capsulitis of the second metatarsal phalangeal joint I feel that this is still the case.  More than likely the lateral pain that she is starting to feel is because of the lateral compensation.  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  Plan: Start her on a Medrol Dosepak and injected around the joint today with Kenalog and local anesthetic 20 mg was injected 5 mg of Marcaine to the point of maximal tenderness after sterile Betadine skin prep.  Tolerated procedure well I will follow-up with her in 1 month

## 2017-02-11 ENCOUNTER — Other Ambulatory Visit: Payer: Self-pay | Admitting: Physician Assistant

## 2017-02-11 DIAGNOSIS — I10 Essential (primary) hypertension: Secondary | ICD-10-CM

## 2017-02-26 ENCOUNTER — Encounter: Payer: Self-pay | Admitting: Podiatry

## 2017-02-26 ENCOUNTER — Ambulatory Visit: Payer: BC Managed Care – PPO | Admitting: Podiatry

## 2017-02-26 DIAGNOSIS — L03032 Cellulitis of left toe: Secondary | ICD-10-CM

## 2017-02-26 DIAGNOSIS — L600 Ingrowing nail: Secondary | ICD-10-CM

## 2017-02-26 NOTE — Progress Notes (Signed)
This patient presents the office with chief complaint of severe pain noted to the second toenail left foot.  Patient states that the pain  and infection occurred after a pedicure 2 weeks ago.  She self treated herself by soaking in Epsom salts and applying Neosporin.  The toe continued to become more red and infected.  She presents the office today for an evaluation and treatment of this painful infected toenail, left foot.  General Appearance  Alert, conversant and in no acute stress.  Vascular  Dorsalis pedis and posterior pulses are palpable  bilaterally.  Capillary return is within normal limits  bilaterally. Temperature is within normal limits  Bilaterally.  Neurologic  Senn-Weinstein monofilament wire test within normal limits  bilaterally. Muscle power within normal limits bilaterally.  Nails redness and swelling noted along the medial and lateral aspects of the second toenail left foot.  Granulation tissue is noted along the lateral aspect of the toenail. The nail plate is loosely attached to the nail bed.  Orthopedic  No limitations of motion of motion feet bilaterally.  No crepitus or effusions noted.  No bony pathology or digital deformities noted.  Skin  normotropic skin with no porokeratosis noted bilaterally.     Paronychia second toenail left foot.    ROV.  Incision and drainage second toenail left foot. Treatment options and alternatives discussed.  Recommended an incision and drainage and patient agreed.  Second toenail left foot  was prepped with alcohol and a 3cc. of  2% lidocaine plain was administered in a digital block fashion.  The toe was then prepped with betadine solution .  The offending nail border was then excised and all necrotic tissue was resected.  The area was then cleansed  and antibiotic ointment and a dry sterile dressing was applied.  The patient was dispensed instructions for aftercare. Prescribed cephalexin 500 mg.  # 15.  RTC prn   Helane GuntherGregory Tien Aispuro DPM

## 2017-02-27 MED ORDER — CEPHALEXIN 500 MG PO CAPS: 500.0000 mg | ORAL_CAPSULE | Freq: Two times a day (BID) | ORAL | 0 refills | Status: DC

## 2017-02-27 NOTE — Addendum Note (Signed)
Addended byMaury Dus: Kahli Mayon L on: 02/27/2017 01:15 PM   Modules accepted: Orders

## 2017-02-28 ENCOUNTER — Ambulatory Visit: Payer: BC Managed Care – PPO | Admitting: Podiatry

## 2017-04-14 ENCOUNTER — Ambulatory Visit (INDEPENDENT_AMBULATORY_CARE_PROVIDER_SITE_OTHER): Payer: BC Managed Care – PPO | Admitting: Family Medicine

## 2017-04-14 ENCOUNTER — Encounter: Payer: Self-pay | Admitting: Family Medicine

## 2017-04-14 VITALS — BP 124/70 | HR 86 | Temp 97.8°F | Resp 16 | Wt 242.0 lb

## 2017-04-14 DIAGNOSIS — J0140 Acute pansinusitis, unspecified: Secondary | ICD-10-CM

## 2017-04-14 MED ORDER — AMOXICILLIN-POT CLAVULANATE 875-125 MG PO TABS: 1.0000 | ORAL_TABLET | Freq: Two times a day (BID) | ORAL | 0 refills | Status: DC

## 2017-04-14 NOTE — Progress Notes (Signed)
Patient: Erin HackerHeather Leigh Holley Frye Female    DOB: 11/01/1970   47 y.o.   MRN: [id number] Visit Date: 04/14/2017  Today's Provider: Megan Mansichard Brysin Towery Jr, MD   Chief Complaint  Patient presents with  . URI   Subjective:    HPI Upper Respiratory Infection: Patient complains of symptoms of a URI, possible sinusitis. Symptoms include right ear pain, congestion, fever and headache. Onset of symptoms was 1 week ago, gradually worsening since that time. She also c/o congestion, facial pain, headache described as pressure, post nasal drip, purulent nasal discharge and sinus pressure for the past 1 day .  She is drinking plenty of fluids. Evaluation to date: none. Treatment to date: antihistamines, decongestants, nasal steroids and Mucinex, Sudafed, and Tylenol.      Allergies  Allergen Reactions  . Codeine     itching     Current Outpatient Medications:  .  ferrous sulfate 325 (65 FE) MG tablet, Take by mouth., Disp: , Rfl:  .  fexofenadine (ALLEGRA) 180 MG tablet, Take by mouth., Disp: , Rfl:  .  fluticasone (FLONASE ALLERGY RELIEF) 50 MCG/ACT nasal spray, Place into the nose., Disp: , Rfl:  .  losartan-hydrochlorothiazide (HYZAAR) 100-25 MG tablet, TAKE 1 TABLET DAILY, Disp: 90 tablet, Rfl: 1 .  MULTIPLE VITAMIN PO, Take by mouth., Disp: , Rfl:  .  simvastatin (ZOCOR) 20 MG tablet, TAKE 1 TABLET BY MOUTH EVERY EVENING, Disp: 90 tablet, Rfl: 1  Review of Systems  Constitutional: Positive for chills, diaphoresis and fever.  HENT: Positive for congestion, ear pain, postnasal drip, rhinorrhea, sinus pressure and sinus pain.   Respiratory: Positive for cough.   Endocrine: Negative.   Allergic/Immunologic: Negative.   Neurological: Positive for headaches.  Psychiatric/Behavioral: Negative.     Social History   Tobacco Use  . Smoking status: Never Smoker  . Smokeless tobacco: Never Used  Substance Use Topics  . Alcohol use: Yes    Alcohol/week: 0.0 oz    Comment: rare    Objective:   BP 124/70 (BP Location: Left Arm, Patient Position: Sitting, Cuff Size: Large)   Pulse 86   Temp 97.8 F (36.6 C) (Oral)   Resp 16   Wt 242 lb (109.8 kg)   SpO2 98%   BMI 42.87 kg/m  Vitals:   04/14/17 0959  BP: 124/70  Pulse: 86  Resp: 16  Temp: 97.8 F (36.6 C)  TempSrc: Oral  SpO2: 98%  Weight: 242 lb (109.8 kg)     Physical Exam  Constitutional: She is oriented to person, place, and time. She appears well-developed and well-nourished.  Obese WF NAD.  HENT:  Head: Normocephalic and atraumatic.  Right Ear: External ear normal.  Left Ear: External ear normal.  Nose: Nose normal.  Mouth/Throat: Oropharynx is clear and moist. No oropharyngeal exudate.  Tender across sinuses.   Eyes: No scleral icterus.  Neck: Neck supple.  Cardiovascular: Normal rate and regular rhythm.  Pulmonary/Chest: Effort normal and breath sounds normal.  Abdominal: Soft.  Lymphadenopathy:    She has no cervical adenopathy.  Neurological: She is alert and oriented to person, place, and time.  Skin: Skin is warm and dry.  Psychiatric: She has a normal mood and affect. Her behavior is normal. Judgment and thought content normal.        Assessment & Plan:     Sinusitis Augmentin,fluids,Sinus rinses.     I have done the exam and reviewed the above chart and it is accurate  to the best of my knowledge. Development worker, community has been used in this note in any air is in the dictation or transcription are unintentional.  Wilhemena Durie, MD  [place]

## 2017-05-02 ENCOUNTER — Ambulatory Visit (INDEPENDENT_AMBULATORY_CARE_PROVIDER_SITE_OTHER): Payer: BC Managed Care – PPO | Admitting: Physician Assistant

## 2017-05-02 ENCOUNTER — Encounter: Payer: Self-pay | Admitting: Physician Assistant

## 2017-05-02 ENCOUNTER — Other Ambulatory Visit: Payer: Self-pay

## 2017-05-02 VITALS — BP 132/90 | HR 96 | Wt 240.0 lb

## 2017-05-02 DIAGNOSIS — Z6841 Body Mass Index (BMI) 40.0 and over, adult: Secondary | ICD-10-CM

## 2017-05-02 DIAGNOSIS — E782 Mixed hyperlipidemia: Secondary | ICD-10-CM

## 2017-05-02 DIAGNOSIS — I10 Essential (primary) hypertension: Secondary | ICD-10-CM

## 2017-05-02 DIAGNOSIS — R7309 Other abnormal glucose: Secondary | ICD-10-CM | POA: Diagnosis not present

## 2017-05-02 NOTE — Patient Instructions (Signed)

## 2017-05-02 NOTE — Progress Notes (Addendum)
Patient: Erin Frye Female    DOB: 07/11/1970   47 y.o.   MRN: [id number] Visit Date: 05/02/2017  Today's Provider: Margaretann LovelessJennifer M Daisja Kessinger, PA-C   Chief Complaint  Patient presents with  . Hypertension   Subjective:    HPI  Hypertension: Patient here for follow-up of elevated blood pressure. She is not exercising and is adherent to low salt diet.  Blood pressure is well controlled at home. Cardiac symptoms none. Patient denies chest pain.  Cardiovascular risk factors: none. Use of agents associated with hypertension: none. History of target organ damage: none. Patient was at the dentist and her blood pressure was 140/90 and went to 138/90.  Patient denied any issues.  She recheck her BP at home 112/70.   Allergies  Allergen Reactions  . Codeine     itching     Current Outpatient Medications:  .  amoxicillin-clavulanate (AUGMENTIN) 875-125 MG tablet, Take 1 tablet by mouth 2 (two) times daily., Disp: 20 tablet, Rfl: 0 .  ferrous sulfate 325 (65 FE) MG tablet, Take by mouth., Disp: , Rfl:  .  fexofenadine (ALLEGRA) 180 MG tablet, Take by mouth., Disp: , Rfl:  .  fluticasone (FLONASE ALLERGY RELIEF) 50 MCG/ACT nasal spray, Place into the nose., Disp: , Rfl:  .  losartan-hydrochlorothiazide (HYZAAR) 100-25 MG tablet, TAKE 1 TABLET DAILY, Disp: 90 tablet, Rfl: 1 .  MULTIPLE VITAMIN PO, Take by mouth., Disp: , Rfl:  .  simvastatin (ZOCOR) 20 MG tablet, TAKE 1 TABLET BY MOUTH EVERY EVENING, Disp: 90 tablet, Rfl: 1  Review of Systems  Constitutional: Negative.   HENT: Positive for postnasal drip.   Respiratory: Positive for cough. Negative for chest tightness and shortness of breath.   Cardiovascular: Negative.   Gastrointestinal: Negative.   Allergic/Immunologic: Positive for environmental allergies.  Neurological: Negative.     Social History   Tobacco Use  . Smoking status: Never Smoker  . Smokeless tobacco: Never Used  Substance Use Topics  . Alcohol  use: Yes    Alcohol/week: 0.0 oz    Comment: rare   Objective:   There were no vitals taken for this visit. There were no vitals filed for this visit.   Physical Exam  Constitutional: She appears well-developed and well-nourished. No distress.  Eyes: Pupils are equal, round, and reactive to light. EOM are normal.  Neck: Normal range of motion. Neck supple. No JVD present. No tracheal deviation present. No thyromegaly present.  Cardiovascular: Normal rate, regular rhythm and normal heart sounds. Exam reveals no gallop and no friction rub.  No murmur heard. Pulmonary/Chest: Effort normal and breath sounds normal. No respiratory distress. She has no wheezes. She has no rales.  Musculoskeletal: She exhibits no edema.  Lymphadenopathy:    She has no cervical adenopathy.  Skin: She is not diaphoretic.  Vitals reviewed.  [facility] 05/02/2017  Decreased Interest 0  Down, Depressed, Hopeless 0  PHQ - 2 Score 0  Altered sleeping 2  Tired, decreased energy 1  Change in appetite 0  Feeling bad or failure about yourself  0  Trouble concentrating 0  Moving slowly or fidgety/restless 0  Suicidal thoughts 0  PHQ-9 Score 3  Difficult doing work/chores Not difficult at all      Assessment & Plan:     1. Essential hypertension Suspect elevation due to sudafed use. Advised to discontinue sudafed. Check BP in 2 weeks and call with numbers. Continue losartan-hctz 100-25mg . Will check  labs as below and f/u pending results. - CBC w/Diff/Platelet - Comprehensive Metabolic Panel (CMET) - Lipid Profile - TSH - HgB A1c  2. Elevated hemoglobin A1c H/O this. Will check labs as below and f/u pending results. - CBC w/Diff/Platelet - Comprehensive Metabolic Panel (CMET) - Lipid Profile - TSH - HgB A1c  3. Hypercholesterolemia with hypertriglyceridemia On simvastatin 20mg . Will check labs as below and f/u pending results. - CBC w/Diff/Platelet - Comprehensive Metabolic Panel  (CMET) - Lipid Profile - TSH - HgB A1c  4. BMI 40.0-44.9, adult [facility]) Counseled patient on healthy lifestyle modifications including dieting and exercise.  - CBC w/Diff/Platelet - Comprehensive Metabolic Panel (CMET) - Lipid Profile - TSH - HgB A1c       Margaretann Loveless, PA-C  [facility]

## 2017-05-03 LAB — TSH: TSH: 2.48 u[IU]/mL (ref 0.450–4.500)

## 2017-05-03 LAB — CBC WITH DIFFERENTIAL/PLATELET
BASOS: 1 %
Basophils Absolute: 0 10*3/uL (ref 0.0–0.2)
EOS (ABSOLUTE): 0.3 10*3/uL (ref 0.0–0.4)
Eos: 4 %
HEMATOCRIT: 38.4 % (ref 34.0–46.6)
HEMOGLOBIN: 12.8 g/dL (ref 11.1–15.9)
IMMATURE GRANS (ABS): 0 10*3/uL (ref 0.0–0.1)
Immature Granulocytes: 0 %
LYMPHS: 30 %
Lymphocytes Absolute: 2.5 10*3/uL (ref 0.7–3.1)
MCH: 30 pg (ref 26.6–33.0)
MCHC: 33.3 g/dL (ref 31.5–35.7)
MCV: 90 fL (ref 79–97)
MONOCYTES: 7 %
Monocytes Absolute: 0.6 10*3/uL (ref 0.1–0.9)
NEUTROS ABS: 4.9 10*3/uL (ref 1.4–7.0)
Neutrophils: 58 %
Platelets: 329 10*3/uL (ref 150–379)
RBC: 4.26 x10E6/uL (ref 3.77–5.28)
RDW: 13.5 % (ref 12.3–15.4)
WBC: 8.4 10*3/uL (ref 3.4–10.8)

## 2017-05-03 LAB — LIPID PANEL
Chol/HDL Ratio: 3.5 ratio (ref 0.0–4.4)
Cholesterol, Total: 223 mg/dL — ABNORMAL HIGH (ref 100–199)
HDL: 63 mg/dL (ref >39)
LDL Calculated: 111 mg/dL — ABNORMAL HIGH (ref 0–99)
Triglycerides: 245 mg/dL — ABNORMAL HIGH (ref 0–149)
VLDL CHOLESTEROL CAL: 49 mg/dL — AB (ref 5–40)

## 2017-05-03 LAB — COMPREHENSIVE METABOLIC PANEL
A/G RATIO: 1.5 (ref 1.2–2.2)
ALBUMIN: 4.3 g/dL (ref 3.5–5.5)
ALT: 20 IU/L (ref 0–32)
AST: 20 IU/L (ref 0–40)
Alkaline Phosphatase: 88 IU/L (ref 39–117)
BILIRUBIN TOTAL: 0.3 mg/dL (ref 0.0–1.2)
BUN / CREAT RATIO: 28 — AB (ref 9–23)
BUN: 16 mg/dL (ref 6–24)
CHLORIDE: 99 mmol/L (ref 96–106)
CO2: 25 mmol/L (ref 20–29)
Calcium: 9.9 mg/dL (ref 8.7–10.2)
Creatinine, Ser: 0.58 mg/dL (ref 0.57–1.00)
GFR calc non Af Amer: 109 mL/min/{1.73_m2} (ref >59)
GFR, EST AFRICAN AMERICAN: 126 mL/min/{1.73_m2} (ref >59)
GLOBULIN, TOTAL: 2.8 g/dL (ref 1.5–4.5)
Glucose: 109 mg/dL — ABNORMAL HIGH (ref 65–99)
Potassium: 4.3 mmol/L (ref 3.5–5.2)
Sodium: 141 mmol/L (ref 134–144)
Total Protein: 7.1 g/dL (ref 6.0–8.5)

## 2017-05-03 LAB — HEMOGLOBIN A1C
Est. average glucose Bld gHb Est-mCnc: 134 mg/dL
HEMOGLOBIN A1C: 6.3 % — AB (ref 4.8–5.6)

## 2017-05-04 ENCOUNTER — Encounter: Payer: Self-pay | Admitting: Physician Assistant

## 2017-06-20 ENCOUNTER — Encounter: Payer: Self-pay | Admitting: Physician Assistant

## 2017-06-20 ENCOUNTER — Ambulatory Visit: Payer: BC Managed Care – PPO | Admitting: Physician Assistant

## 2017-06-20 VITALS — BP 138/88 | HR 93 | Temp 98.1°F | Resp 16 | Wt 244.0 lb

## 2017-06-20 DIAGNOSIS — H00014 Hordeolum externum left upper eyelid: Secondary | ICD-10-CM | POA: Diagnosis not present

## 2017-06-20 DIAGNOSIS — S46911A Strain of unspecified muscle, fascia and tendon at shoulder and upper arm level, right arm, initial encounter: Secondary | ICD-10-CM

## 2017-06-20 MED ORDER — PREDNISONE 10 MG PO TABS: ORAL_TABLET | ORAL | 0 refills | Status: DC

## 2017-06-20 NOTE — Progress Notes (Signed)
Patient: Erin Frye Female    DO: 1970-08-05   47 y.o.   MRM: [id number] Visit Date: 06/20/2017  Today's Provider: Margaretann Loveless, PA-C   Chief Complaint  Patient presents with  . Shoulder Pain  . Eye Problem   Subjective:    HPI Patient here today C/O right shoulder pain x's a couple of weeks. Patient reports pain is worse at night and pain worse at night. Patient reports pain wakes her up at night. Patient reports pain and tingling on hand and fingers. Patient reports more pain between elbow and shoulder. Patient reports that she has been throwing a heavy ball to her dog just about every day and reports that it may have something to do with her shoulder pain. Patient reports taking Ibuprofen and reports no symptom improvement.   Patient also C/O stye on left upper eyelid since yesterday evening. Patient reports itching, watery and soreness. Patient denies any discharge from eye or visual changes. Patient reports she did use warm compresses last night.    Allergies  Allergen Reactions  . Codeine     itching     Current Outpatient Medications:  .  ferrous sulfate 325 (65 FE) MG tablet, Take by mouth., Disp: , Rfl:  .  fexofenadine (ALLEGRA) 180 MG tablet, Take by mouth., Disp: , Rfl:  .  fluticasone (FLONASE ALLERGY RELIEF) 50 MCG/ACT nasal spray, Place into the nose., Disp: , Rfl:  .  losartan-hydrochlorothiazide (HYZAAR) 100-25 MG tablet, TAKE 1 TABLET DAILY, Disp: 90 tablet, Rfl: 1 .  MULTIPLE VITAMIN PO, Take by mouth., Disp: , Rfl:  .  simvastatin (ZOCOR) 20 MG tablet, TAKE 1 TABLET BY MOUTH EVERY EVENING, Disp: 90 tablet, Rfl: 1  Review of Systems  Constitutional: Negative.   Eyes: Positive for pain.  Respiratory: Negative.   Cardiovascular: Negative.   Musculoskeletal: Positive for arthralgias and myalgias.    Social History   Tobacco Use  . Smoking status: Never Smoker  . Smokeless tobacco: Never Used  Substance Use Topics  . Alcohol  use: Yes    Alcohol/week: 0.0 oz    Comment: rare   Objective:   BP 138/88 (BP Location: Left Arm, Patient Position: Sitting, Cuff Size: Large)   Pulse 93   Temp 98.1 F (36.7 C) (Oral)   Resp 16   Wt 244 lb (110.7 kg)   SpO2 98%   BMI 43.22 kg/m  Vitals:   06/20/17 1453  BP: 138/88  Pulse: 93  Resp: 16  Temp: 98.1 F (36.7 C)  TempSrc: Oral  SpO2: 98%  Weight: 244 lb (110.7 kg)     Physical Exam  Constitutional: She appears well-developed and well-nourished. No distress.  Eyes: Pupils are equal, round, and reactive to light. Conjunctivae and EOM are normal. Right eye exhibits no chemosis, no discharge, no exudate and no hordeolum. No foreign body present in the right eye. Left eye exhibits hordeolum. Left eye exhibits no chemosis, no discharge and no exudate. No foreign body present in the left eye.    Neck: Normal range of motion. Neck supple.  Cardiovascular: Normal rate, regular rhythm and normal heart sounds. Exam reveals no gallop and no friction rub.  No murmur heard. Pulmonary/Chest: Effort normal and breath sounds normal. No respiratory distress. She has no wheezes. She has no rales.  Musculoskeletal:       Right shoulder: She exhibits decreased range of motion (abduction, ER and IR (by side and at 90 degree  abd)) and tenderness. She exhibits no bony tenderness, no swelling, no effusion, no crepitus, no deformity, no laceration, no pain, no spasm, normal pulse and normal strength.  Skin: She is not diaphoretic.  Vitals reviewed.       Assessment & Plan:     1. Shoulder strain, right, initial encounter Suspect shoulder strain from over use with throwing the ball to her dog. Rest, heat to area, light stretches and prednisone as below for inflammation. Call if symptoms worsen.  - predniSONE (DELTASONE) 10 MG tablet; Take 6 tabs PO on day 1&2, 5 tabs PO on day 3&4, 4 tabs PO on day 5&6, 3 tabs PO on day 7&8, 2 tabs PO on day 9&10, 1 tab PO on day 11&12.  Dispense:  42 tablet; Refill: 0  2. Hordeolum externum of left upper eyelid Warm compresses.        Margaretann LovelessJennifer M Burnette, PA-C  [facility]

## 2017-06-20 NOTE — Patient Instructions (Signed)
Rotator Cuff Tendinitis Rotator cuff tendinitis is inflammation of the tough, cord-like bands that connect muscle to bone (tendons) in the rotator cuff. The rotator cuff includes all of the muscles and tendons that connect the arm to the shoulder. The rotator cuff holds the head of the upper arm bone (humerus) in the cup (fossa) of the shoulder blade (scapula). This condition can lead to a long-lasting (chronic) tear. The tear may be partial or complete. What are the causes? This condition is usually caused by overusing the rotator cuff. What increases the risk? This condition is more likely to develop in athletes and workers who frequently use their shoulder or reach over their heads. This can include activities such as:  Tennis.  Baseball or softball.  Swimming.  Construction work.  Painting.  What are the signs or symptoms? Symptoms of this condition include:  Pain spreading (radiating) from the shoulder to the upper arm.  Swelling and tenderness in front of the shoulder.  Pain when reaching, pulling, or lifting the arm above the head.  Pain when lowering the arm from above the head.  Minor pain in the shoulder when resting.  Increased pain in the shoulder at night.  Difficulty placing the arm behind the back.  How is this diagnosed? This condition is diagnosed with a medical history and physical exam. Tests may also be done, including:  X-rays.  MRI.  Ultrasounds.  CT or MR arthrogram. During this test, a contrast material is injected and then images are taken.  How is this treated? Treatment for this condition depends on the severity of the condition. In less severe cases, treatment may include:  Rest. This may be done with a sling that holds the shoulder still (immobilization). Your health care provider may also recommend avoiding activities that involve lifting your arm over your head.  Icing the shoulder.  Anti-inflammatory medicines, such as aspirin or  ibuprofen.  In more severe cases, treatment may include:  Physical therapy.  Steroid injections.  Surgery.  Follow these instructions at home: If you have a sling:  Wear the sling as told by your health care provider. Remove it only as told by your health care provider.  Loosen the sling if your fingers tingle, become numb, or turn cold and blue.  Keep the sling clean.  If the sling is not waterproof, do not let it get wet. Remove it, if allowed, or cover it with a watertight covering when you take a bath or shower. Managing pain, stiffness, and swelling  If directed, put ice on the injured area. ? If you have a removable sling, remove it as told by your health care provider. ? Put ice in a plastic bag. ? Place a towel between your skin and the bag. ? Leave the ice on for 20 minutes, 2-3 times a day.  Move your fingers often to avoid stiffness and to lessen swelling.  Raise (elevate) the injured area above the level of your heart while you are lying down.  Find a comfortable sleeping position or sleep on a recliner, if available. Driving  Do not drive or use heavy machinery while taking prescription pain medicine.  Ask your health care provider when it is safe to drive if you have a sling on your arm. Activity  Rest your shoulder as told by your health care provider.  Return to your normal activities as told by your health care provider. Ask your health care provider what activities are safe for you.  Do any   exercises or stretches as told by your health care provider.  If you do repetitive overhead tasks, take small breaks in between and include stretching exercises as told by your health care provider. General instructions  Do not use any products that contain nicotine or tobacco, such as cigarettes and e-cigarettes. These can delay healing. If you need help quitting, ask your health care provider.  Take over-the-counter and prescription medicines only as told by  your health care provider.  Keep all follow-up visits as told by your health care provider. This is important. Contact a health care provider if:  Your pain gets worse.  You have new pain in your arm, hands, or fingers.  Your pain is not relieved with medicine or does not get better after 6 weeks of treatment.  You have cracking sensations when moving your shoulder in certain directions.  You hear a snapping sound after using your shoulder, followed by severe pain and weakness. Get help right away if:  Your arm, hand, or fingers are numb or tingling.  Your arm, hand, or fingers are swollen or painful or they turn white or blue. Summary  Rotator cuff tendinitis is inflammation of the tough, cord-like bands that connect muscle to bone (tendons) in the rotator cuff.  This condition is usually caused by overusing the rotator cuff, which includes all of the muscles and tendons that connect the arm to the shoulder.  This condition is more likely to develop in athletes and workers who frequently use their shoulder or reach over their heads.  Treatment generally includes rest, anti-inflammatory medicines, and icing. In some cases, physical therapy and steroid injections may be needed. In severe cases, surgery may be needed. This information is not intended to replace advice given to you by your health care provider. Make sure you discuss any questions you have with your health care provider. Document Released: 03/18/2003 Document Revised: 12/13/2015 Document Reviewed: 12/13/2015 Elsevier Interactive Patient Education  2017 [employer]  

## 2017-07-28 ENCOUNTER — Other Ambulatory Visit: Payer: Self-pay | Admitting: Physician Assistant

## 2017-07-28 DIAGNOSIS — I10 Essential (primary) hypertension: Secondary | ICD-10-CM

## 2017-08-05 ENCOUNTER — Other Ambulatory Visit: Payer: Self-pay | Admitting: Physician Assistant

## 2017-08-05 DIAGNOSIS — E782 Mixed hyperlipidemia: Secondary | ICD-10-CM

## 2017-08-20 ENCOUNTER — Ambulatory Visit: Payer: BC Managed Care – PPO | Admitting: Physician Assistant

## 2017-08-20 ENCOUNTER — Encounter: Payer: Self-pay | Admitting: Physician Assistant

## 2017-08-20 VITALS — BP 138/80 | HR 96 | Temp 98.0°F | Resp 16 | Wt 242.6 lb

## 2017-08-20 DIAGNOSIS — L720 Epidermal cyst: Secondary | ICD-10-CM | POA: Diagnosis not present

## 2017-08-20 DIAGNOSIS — L089 Local infection of the skin and subcutaneous tissue, unspecified: Secondary | ICD-10-CM | POA: Diagnosis not present

## 2017-08-20 MED ORDER — DOXYCYCLINE HYCLATE 100 MG PO TABS: 100.0000 mg | ORAL_TABLET | Freq: Two times a day (BID) | ORAL | 0 refills | Status: DC

## 2017-08-20 NOTE — Progress Notes (Signed)
       Patient: Erin Frye Female    DOB: 04/19/1970   47 y.o.   MRN: [id number] Visit Date: 08/20/2017  Today's Provider: Margaretann LovelessJennifer M Burnette, PA-C   Chief Complaint  Patient presents with  . Cyst   Subjective:    HPI Patient here today with c/o possible cyst at the base of neck. Reports that is tender and sore. Treatment tried: warm compress. Husband had one last week in his groin. She is concerned with them passing something back and forth between each other.     Allergies  Allergen Reactions  . Codeine     itching     Current Outpatient Medications:  .  ferrous sulfate 325 (65 FE) MG tablet, Take by mouth., Disp: , Rfl:  .  fexofenadine (ALLEGRA) 180 MG tablet, Take by mouth., Disp: , Rfl:  .  fluticasone (FLONASE ALLERGY RELIEF) 50 MCG/ACT nasal spray, Place into the nose., Disp: , Rfl:  .  losartan-hydrochlorothiazide (HYZAAR) 100-25 MG tablet, TAKE 1 TABLET DAILY, Disp: 90 tablet, Rfl: 1 .  MULTIPLE VITAMIN PO, Take by mouth., Disp: , Rfl:  .  simvastatin (ZOCOR) 20 MG tablet, TAKE 1 TABLET BY MOUTH EVERY EVENING, Disp: 90 tablet, Rfl: 1  Review of Systems  Constitutional: Negative.   Respiratory: Negative.   Cardiovascular: Negative.   Skin: Positive for wound.  Neurological: Negative.     Social History   Tobacco Use  . Smoking status: Never Smoker  . Smokeless tobacco: Never Used  Substance Use Topics  . Alcohol use: Yes    Alcohol/week: 0.0 standard drinks    Comment: rare   Objective:   BP 138/80 (BP Location: Left Arm, Patient Position: Sitting, Cuff Size: Normal)   Pulse 96   Temp 98 F (36.7 C) (Oral)   Resp 16   Wt 242 lb 9.6 oz (110 kg)   LMP 08/04/2015   SpO2 97%   BMI 42.97 kg/m  Vitals:   08/20/17 1325  BP: 138/80  Pulse: 96  Resp: 16  Temp: 98 F (36.7 C)  TempSrc: Oral  SpO2: 97%  Weight: 242 lb 9.6 oz (110 kg)     Physical Exam  Constitutional: She appears well-developed and well-nourished. No distress.    Neck: Normal range of motion. Neck supple.  Pulmonary/Chest: Effort normal. No respiratory distress. She has no wheezes.  Skin: She is not diaphoretic.     Vitals reviewed.       Assessment & Plan:     1. Infected epidermoid cyst Wound culture collected today. Doxycycline given as below for infection. I will f/u pending results of culture. If still large and painful in a couple of days she is to return for I&D. May use tylenol and IBU alternating every 3 hours prn for pain. Continue warm compresses. Call if no improvements.  - doxycycline (VIBRA-TABS) 100 MG tablet; Take 1 tablet (100 mg total) by mouth 2 (two) times daily.  Dispense: 20 tablet; Refill: 0 - Aerobic culture       Margaretann LovelessJennifer M Burnette, PA-C  [facility]

## 2017-08-20 NOTE — Patient Instructions (Signed)
Epidermal Cyst An epidermal cyst is sometimes called an epidermal inclusion cyst or an infundibular cyst. It is a sac made of skin tissue. The sac contains a substance called keratin. Keratin is a protein that is normally secreted through the hair follicles. When keratin becomes trapped in the top layer of skin (epidermis), it can form an epidermal cyst. Epidermal cysts are usually found on the face, neck, trunk, and genitals. These cysts are usually harmless (benign), and they may not cause symptoms unless they become infected. It is important not to pop epidermal cysts yourself. What are the causes? This condition may be caused by:  A blocked hair follicle.  A hair that curls and re-enters the skin instead of growing straight out of the skin (ingrown hair).  A blocked pore.  Irritated skin.  An injury to the skin.  Certain conditions that are passed along from parent to child (inherited).  Human papillomavirus (HPV).  What increases the risk? The following factors may make you more likely to develop an epidermal cyst:  Having acne.  Being overweight.  Wearing tight clothing.  What are the signs or symptoms? The only symptom of this condition may be a small, painless lump underneath the skin. When an epidermal cyst becomes infected, symptoms may include:  Redness.  Inflammation.  Tenderness.  Warmth.  Fever.  Keratin draining from the cyst. Keratin may look like a grayish-white, bad-smelling substance.  Pus draining from the cyst.  How is this diagnosed? This condition is diagnosed with a physical exam. In some cases, you may have a sample of tissue (biopsy) taken from your cyst to be examined under a microscope or tested for bacteria. You may be referred to a health care provider who specializes in skin care (dermatologist). How is this treated? In many cases, epidermal cysts go away on their own without treatment. If a cyst becomes infected, treatment may  include:  Opening and draining the cyst. After draining, minor surgery to remove the rest of the cyst may be done.  Antibiotic medicine to help prevent infection.  Injections of medicines (steroids) that help to reduce inflammation.  Surgery to remove the cyst. Surgery may be done if: ? The cyst becomes large. ? The cyst bothers you. ? There is a chance that the cyst could turn into cancer.  Follow these instructions at home:  Take over-the-counter and prescription medicines only as told by your health care provider.  If you were prescribed an antibiotic, use it as told by your health care provider. Do not stop using the antibiotic even if you start to feel better.  Keep the area around your cyst clean and dry.  Wear loose, dry clothing.  Do not try to pop your cyst.  Avoid touching your cyst.  Check your cyst every day for signs of infection.  Keep all follow-up visits as told by your health care provider. This is important. How is this prevented?  Wear clean, dry, clothing.  Avoid wearing tight clothing.  Keep your skin clean and dry. Shower or take baths every day.  Wash your body with a benzoyl peroxide wash when you shower or bathe. Contact a health care provider if:  Your cyst develops symptoms of infection.  Your condition is not improving or is getting worse.  You develop a cyst that looks different from other cysts you have had.  You have a fever. Get help right away if:  Redness spreads from the cyst into the surrounding area. This information is   not intended to replace advice given to you by your health care provider. Make sure you discuss any questions you have with your health care provider. Document Released: 11/27/2003 Document Revised: 08/25/2015 Document Reviewed: 10/28/2014 Elsevier Interactive Patient Education  2018 [employer]  

## 2017-08-21 ENCOUNTER — Encounter: Payer: Self-pay | Admitting: Physician Assistant

## 2017-08-21 NOTE — Telephone Encounter (Signed)
Please call her and schedule her for in the morning 40 min I &D if she can do that. She can be my 8 o'clock or 9 o'clock

## 2017-08-22 LAB — AEROBIC CULTURE

## 2017-08-28 ENCOUNTER — Ambulatory Visit: Payer: BC Managed Care – PPO | Admitting: Certified Nurse Midwife

## 2017-08-30 ENCOUNTER — Encounter: Payer: Self-pay | Admitting: Physician Assistant

## 2017-08-30 ENCOUNTER — Ambulatory Visit (INDEPENDENT_AMBULATORY_CARE_PROVIDER_SITE_OTHER): Payer: BC Managed Care – PPO | Admitting: Physician Assistant

## 2017-08-30 ENCOUNTER — Other Ambulatory Visit (HOSPITAL_COMMUNITY)
Admission: RE | Admit: 2017-08-30 | Discharge: 2017-08-30 | Disposition: A | Discharge status: Home / Self Care | Payer: BC Managed Care – PPO | Source: Ambulatory Visit | Attending: Physician Assistant | Admitting: Physician Assistant

## 2017-08-30 VITALS — BP 132/80 | HR 108 | Temp 98.3°F | Resp 16 | Ht 63.0 in | Wt 247.0 lb

## 2017-08-30 DIAGNOSIS — E782 Mixed hyperlipidemia: Secondary | ICD-10-CM | POA: Diagnosis not present

## 2017-08-30 DIAGNOSIS — I10 Essential (primary) hypertension: Secondary | ICD-10-CM | POA: Diagnosis not present

## 2017-08-30 DIAGNOSIS — L729 Follicular cyst of the skin and subcutaneous tissue, unspecified: Secondary | ICD-10-CM

## 2017-08-30 DIAGNOSIS — L089 Local infection of the skin and subcutaneous tissue, unspecified: Secondary | ICD-10-CM

## 2017-08-30 DIAGNOSIS — Z124 Encounter for screening for malignant neoplasm of cervix: Secondary | ICD-10-CM | POA: Diagnosis not present

## 2017-08-30 DIAGNOSIS — Z1239 Encounter for other screening for malignant neoplasm of breast: Secondary | ICD-10-CM

## 2017-08-30 DIAGNOSIS — Z0000 Encounter for general adult medical examination without abnormal findings: Secondary | ICD-10-CM | POA: Diagnosis not present

## 2017-08-30 DIAGNOSIS — M7541 Impingement syndrome of right shoulder: Secondary | ICD-10-CM

## 2017-08-30 DIAGNOSIS — R7303 Prediabetes: Secondary | ICD-10-CM

## 2017-08-30 DIAGNOSIS — Z1231 Encounter for screening mammogram for malignant neoplasm of breast: Secondary | ICD-10-CM

## 2017-08-30 MED ORDER — METHYLPREDNISOLONE 4 MG PO TBPK: ORAL_TABLET | ORAL | 0 refills | Status: DC

## 2017-08-30 MED ORDER — DOXYCYCLINE HYCLATE 100 MG PO TABS: 100.0000 mg | ORAL_TABLET | Freq: Two times a day (BID) | ORAL | 0 refills | Status: DC

## 2017-08-30 NOTE — Patient Instructions (Signed)
Shoulder Impingement Syndrome Shoulder impingement syndrome is a condition that causes pain when connective tissues (tendons) surrounding the shoulder joint become pinched. These tendons are part of the group of muscles and tissues that help to stabilize the shoulder (rotator cuff). Beneath the rotator cuff is a fluid-filled sac (bursa) that allows the muscles and tendons to glide smoothly. The bursa may become swollen or irritated (bursitis). Bursitis, swelling in the rotator cuff tendons, or both conditions can decrease how much space is under a bone in the shoulder joint (acromion), resulting in impingement. What are the causes? Shoulder impingement syndrome can be caused by bursitis or swelling of the rotator cuff tendons, which may result from:  Repetitive overhead arm movements.  Falling onto the shoulder.  Weakness in the shoulder muscles.  What increases the risk? You may be more likely to develop this condition if you are an athlete who participates in:  Sports that involve throwing, such as baseball.  Tennis.  Swimming.  Volleyball.  Some people are also more likely to develop impingement syndrome because of the shape of their acromion bone. What are the signs or symptoms? The main symptom of this condition is pain on the front or side of the shoulder. Pain may:  Get worse when lifting or raising the arm.  Get worse at night.  Wake you up from sleeping.  Feel sharp when the shoulder is moved, and then fade to an ache.  Other signs and symptoms may include:  Tenderness.  Stiffness.  Inability to raise the arm above shoulder level or behind the body.  Weakness.  How is this diagnosed? This condition may be diagnosed based on:  Your symptoms.  Your medical history.  A physical exam.  Imaging tests, such as: ? X-rays. ? MRI. ? Ultrasound.  How is this treated? Treatment for this condition may include:  Resting your shoulder and avoiding all  activities that cause pain or put stress on the shoulder.  Icing your shoulder.  NSAIDs to help reduce pain and swelling.  One or more injections of medicines to numb the area and reduce inflammation.  Physical therapy.  Surgery. This may be needed if nonsurgical treatments have not helped. Surgery may involve repairing the rotator cuff, reshaping the acromion, or removing the bursa.  Follow these instructions at home: Managing pain, stiffness, and swelling  If directed, apply ice to the injured area. ? Put ice in a plastic bag. ? Place a towel between your skin and the bag. ? Leave the ice on for 20 minutes, 2-3 times a day. Activity  Rest and return to your normal activities as told by your health care provider. Ask your health care provider what activities are safe for you.  Do exercises as told by your health care provider. General instructions  Do not use any tobacco products, including cigarettes, chewing tobacco, or e-cigarettes. Tobacco can delay healing. If you need help quitting, ask your health care provider.  Ask your health care provider when it is safe for you to drive.  Take over-the-counter and prescription medicines only as told by your health care provider.  Keep all follow-up visits as told by your health care provider. This is important. How is this prevented?  Give your body time to rest between periods of activity.  Be safe and responsible while being active to avoid falls.  Maintain physical fitness, including strength and flexibility. Contact a health care provider if:  Your symptoms have not improved after 1-2 months of treatment and   rest.  You cannot lift your arm away from your body. This information is not intended to replace advice given to you by your health care provider. Make sure you discuss any questions you have with your health care provider. Document Released: 12/26/2004 Document Revised: 09/02/2015 Document Reviewed:  11/28/2014 Elsevier Interactive Patient Education  2018 [place] Maintenance, Female Adopting a healthy lifestyle and getting preventive care can go a long way to promote health and wellness. Talk with your health care provider about what schedule of regular examinations is right for you. This is a good chance for you to check in with your provider about disease prevention and staying healthy. In between checkups, there are plenty of things you can do on your own. Experts have done a lot of research about which lifestyle changes and preventive measures are most likely to keep you healthy. Ask your health care provider for more information. Weight and diet Eat a healthy diet  Be sure to include plenty of vegetables, fruits, low-fat dairy products, and lean protein.  Do not eat a lot of foods high in solid fats, added sugars, or salt.  Get regular exercise. This is one of the most important things you can do for your health. ? Most adults should exercise for at least 150 minutes each week. The exercise should increase your heart rate and make you sweat (moderate-intensity exercise). ? Most adults should also do strengthening exercises at least twice a week. This is in addition to the moderate-intensity exercise.  Maintain a healthy weight  Body mass index (BMI) is a measurement that can be used to identify possible weight problems. It estimates body fat based on height and weight. Your health care provider can help determine your BMI and help you achieve or maintain a healthy weight.  For females 25 years of age and older: ? A BMI below 18.5 is considered underweight. ? A BMI of 18.5 to 24.9 is normal. ? A BMI of 25 to 29.9 is considered overweight. ? A BMI of 30 and above is considered obese.  Watch levels of cholesterol and blood lipids  You should start having your blood tested for lipids and cholesterol at 49 years of age, then have this test every 5 years.  You may need  to have your cholesterol levels checked more often if: ? Your lipid or cholesterol levels are high. ? You are older than 49 years of age. ? You are at high risk for heart disease.  Cancer screening Lung Cancer  Lung cancer screening is recommended for adults 73-83 years old who are at high risk for lung cancer because of a history of smoking.  A yearly low-dose CT scan of the lungs is recommended for people who: ? Currently smoke. ? Have quit within the past 15 years. ? Have at least a 30-pack-year history of smoking. A pack year is smoking an average of one pack of cigarettes a day for 1 year.  Yearly screening should continue until it has been 15 years since you quit.  Yearly screening should stop if you develop a health problem that would prevent you from having lung cancer treatment.  Breast Cancer  Practice breast self-awareness. This means understanding how your breasts normally appear and feel.  It also means doing regular breast self-exams. Let your health care provider know about any changes, no matter how small.  If you are in your 20s or 30s, you should have a clinical breast exam (CBE) by a health care  provider every 1-3 years as part of a regular health exam.  If you are 12 or older, have a CBE every year. Also consider having a breast X-ray (mammogram) every year.  If you have a family history of breast cancer, talk to your health care provider about genetic screening.  If you are at high risk for breast cancer, talk to your health care provider about having an MRI and a mammogram every year.  Breast cancer gene (BRCA) assessment is recommended for women who have family members with BRCA-related cancers. BRCA-related cancers include: ? Breast. ? Ovarian. ? Tubal. ? Peritoneal cancers.  Results of the assessment will determine the need for genetic counseling and BRCA1 and BRCA2 testing.  Cervical Cancer Your health care provider may recommend that you be  screened regularly for cancer of the pelvic organs (ovaries, uterus, and vagina). This screening involves a pelvic examination, including checking for microscopic changes to the surface of your cervix (Pap test). You may be encouraged to have this screening done every 3 years, beginning at age 39.  For women ages 38-65, health care providers may recommend pelvic exams and Pap testing every 3 years, or they may recommend the Pap and pelvic exam, combined with testing for human papilloma virus (HPV), every 5 years. Some types of HPV increase your risk of cervical cancer. Testing for HPV may also be done on women of any age with unclear Pap test results.  Other health care providers may not recommend any screening for nonpregnant women who are considered low risk for pelvic cancer and who do not have symptoms. Ask your health care provider if a screening pelvic exam is right for you.  If you have had past treatment for cervical cancer or a condition that could lead to cancer, you need Pap tests and screening for cancer for at least 20 years after your treatment. If Pap tests have been discontinued, your risk factors (such as having a new sexual partner) need to be reassessed to determine if screening should resume. Some women have medical problems that increase the chance of getting cervical cancer. In these cases, your health care provider may recommend more frequent screening and Pap tests.  Colorectal Cancer  This type of cancer can be detected and often prevented.  Routine colorectal cancer screening usually begins at 49 years of age and continues through 49 years of age.  Your health care provider may recommend screening at an earlier age if you have risk factors for colon cancer.  Your health care provider may also recommend using home test kits to check for hidden blood in the stool.  A small camera at the end of a tube can be used to examine your colon directly (sigmoidoscopy or colonoscopy).  This is done to check for the earliest forms of colorectal cancer.  Routine screening usually begins at age 29.  Direct examination of the colon should be repeated every 5-10 years through 49 years of age. However, you may need to be screened more often if early forms of precancerous polyps or small growths are found.  Skin Cancer  Check your skin from head to toe regularly.  Tell your health care provider about any new moles or changes in moles, especially if there is a change in a mole's shape or color.  Also tell your health care provider if you have a mole that is larger than the size of a pencil eraser.  Always use sunscreen. Apply sunscreen liberally and repeatedly throughout the day.  Protect yourself by wearing long sleeves, pants, a wide-brimmed hat, and sunglasses whenever you are outside.  Heart disease, diabetes, and high blood pressure  High blood pressure causes heart disease and increases the risk of stroke. High blood pressure is more likely to develop in: ? People who have blood pressure in the high end of the normal range (130-139/85-89 mm Hg). ? People who are overweight or obese. ? People who are African American.  If you are 26-11 years of age, have your blood pressure checked every 3-5 years. If you are 28 years of age or older, have your blood pressure checked every year. You should have your blood pressure measured twice-once when you are at a hospital or clinic, and once when you are not at a hospital or clinic. Record the average of the two measurements. To check your blood pressure when you are not at a hospital or clinic, you can use: ? An automated blood pressure machine at a pharmacy. ? A home blood pressure monitor.  If you are between 42 years and 30 years old, ask your health care provider if you should take aspirin to prevent strokes.  Have regular diabetes screenings. This involves taking a blood sample to check your fasting blood sugar level. ? If  you are at a normal weight and have a low risk for diabetes, have this test once every three years after 49 years of age. ? If you are overweight and have a high risk for diabetes, consider being tested at a younger age or more often. Preventing infection Hepatitis B  If you have a higher risk for hepatitis B, you should be screened for this virus. You are considered at high risk for hepatitis B if: ? You were born in a country where hepatitis B is common. Ask your health care provider which countries are considered high risk. ? Your parents were born in a high-risk country, and you have not been immunized against hepatitis B (hepatitis B vaccine). ? You have HIV or AIDS. ? You use needles to inject street drugs. ? You live with someone who has hepatitis B. ? You have had sex with someone who has hepatitis B. ? You get hemodialysis treatment. ? You take certain medicines for conditions, including cancer, organ transplantation, and autoimmune conditions.  Hepatitis C  Blood testing is recommended for: ? Everyone born from [birth year] through 1967. ? Anyone with known risk factors for hepatitis C.  Sexually transmitted infections (STIs)  You should be screened for sexually transmitted infections (STIs) including gonorrhea and chlamydia if: ? You are sexually active and are younger than 49 years of age. ? You are older than 49 years of age and your health care provider tells you that you are at risk for this type of infection. ? Your sexual activity has changed since you were last screened and you are at an increased risk for chlamydia or gonorrhea. Ask your health care provider if you are at risk.  If you do not have HIV, but are at risk, it may be recommended that you take a prescription medicine daily to prevent HIV infection. This is called pre-exposure prophylaxis (PrEP). You are considered at risk if: ? You are sexually active and do not regularly use condoms or know the HIV status of your  partner(s). ? You take drugs by injection. ? You are sexually active with a partner who has HIV.  Talk with your health care provider about whether you are at high risk of  being infected with HIV. If you choose to begin PrEP, you should first be tested for HIV. You should then be tested every 3 months for as long as you are taking PrEP. Pregnancy  If you are premenopausal and you may become pregnant, ask your health care provider about preconception counseling.  If you may become pregnant, take 400 to 800 micrograms (mcg) of folic acid every day.  If you want to prevent pregnancy, talk to your health care provider about birth control (contraception). Osteoporosis and menopause  Osteoporosis is a disease in which the bones lose minerals and strength with aging. This can result in serious bone fractures. Your risk for osteoporosis can be identified using a bone density scan.  If you are 66 years of age or older, or if you are at risk for osteoporosis and fractures, ask your health care provider if you should be screened.  Ask your health care provider whether you should take a calcium or vitamin D supplement to lower your risk for osteoporosis.  Menopause may have certain physical symptoms and risks.  Hormone replacement therapy may reduce some of these symptoms and risks. Talk to your health care provider about whether hormone replacement therapy is right for you. Follow these instructions at home:  Schedule regular health, dental, and eye exams.  Stay current with your immunizations.  Do not use any tobacco products including cigarettes, chewing tobacco, or electronic cigarettes.  If you are pregnant, do not drink alcohol.  If you are breastfeeding, limit how much and how often you drink alcohol.  Limit alcohol intake to no more than 1 drink per day for nonpregnant women. One drink equals 12 ounces of beer, 5 ounces of wine, or 1 ounces of hard liquor.  Do not use street  drugs.  Do not share needles.  Ask your health care provider for help if you need support or information about quitting drugs.  Tell your health care provider if you often feel depressed.  Tell your health care provider if you have ever been abused or do not feel safe at home. This information is not intended to replace advice given to you by your health care provider. Make sure you discuss any questions you have with your health care provider. Document Released: 07/11/2010 Document Revised: 06/03/2015 Document Reviewed: 09/29/2014 Elsevier Interactive Patient Education  [employer].

## 2017-08-30 NOTE — Progress Notes (Signed)
Patient: Erin Frye, Female    DOB: 1971-01-03, 47 y.o.   MRN: [id number] Visit Date: 08/30/2017  Today's Provider: Margaretann Loveless, PA-C   Chief Complaint  Patient presents with  . Annual Exam   Subjective:    Annual physical exam Erin Frye is a 47 y.o. female who presents today for health maintenance and complete physical. She feels well. She reports exercising. She reports she is sleeping poorly. -----------------------------------------------------------------   Review of Systems  Constitutional: Negative.   HENT: Negative.   Eyes: Negative.   Respiratory: Negative.   Cardiovascular: Negative.   Gastrointestinal: Negative.   Endocrine: Negative.   Genitourinary: Negative.   Musculoskeletal: Negative.   Skin: Negative.   Allergic/Immunologic: Negative.   Neurological: Negative.   Hematological: Negative.   Psychiatric/Behavioral: Negative.     Social History      She  reports that she has never smoked. She has never used smokeless tobacco. She reports that she drinks alcohol. She reports that she does not use drugs.       Social History   Socioeconomic History  . Marital status: Married    Spouse name: Not on file  . Number of children: Not on file  . Years of education: Not on file  . Highest education level: Not on file  Occupational History  . Not on file  Social Needs  . Financial resource strain: Not on file  . Food insecurity:    Worry: Not on file    Inability: Not on file  . Transportation needs:    Medical: Not on file    Non-medical: Not on file  Tobacco Use  . Smoking status: Never Smoker  . Smokeless tobacco: Never Used  Substance and Sexual Activity  . Alcohol use: Yes    Alcohol/week: 0.0 standard drinks    Comment: rare  . Drug use: No  . Sexual activity: Not on file  Lifestyle  . Physical activity:    Days per week: Not on file    Minutes per session: Not on file  . Stress: Not on file    Relationships  . Social connections:    Talks on phone: Not on file    Gets together: Not on file    Attends religious service: Not on file    Active member of club or organization: Not on file    Attends meetings of clubs or organizations: Not on file    Relationship status: Not on file  Other Topics Concern  . Not on file  Social History Narrative  . Not on file    Past Medical History:  Diagnosis Date  . Hypertension      Patient Active Problem List   Diagnosis Date Noted  . Primary osteoarthritis of both knees 03/30/2015  . Hypercholesterolemia with hypertriglyceridemia 02/12/2015  . Perimenopausal 02/12/2015  . Prediabetes 02/12/2015  . Allergic rhinitis 08/24/2014  . Absolute anemia 08/24/2014  . Essential hypertension 08/20/2014    Past Surgical History:  Procedure Laterality Date  . CESAREAN SECTION  2004  . GALLBLADDER SURGERY  2007    Family History        Family Status  Relation Name Status  . Mother  Deceased       01/22/2016  . Father  Other  . Brother  Alive        Her family history includes Diabetes in her mother; Healthy in her brother.      Allergies  Allergen Reactions  . Codeine     itching     Current Outpatient Medications:  .  ferrous sulfate 325 (65 FE) MG tablet, Take by mouth., Disp: , Rfl:  .  fexofenadine (ALLEGRA) 180 MG tablet, Take by mouth., Disp: , Rfl:  .  fluticasone (FLONASE ALLERGY RELIEF) 50 MCG/ACT nasal spray, Place into the nose., Disp: , Rfl:  .  losartan-hydrochlorothiazide (HYZAAR) 100-25 MG tablet, TAKE 1 TABLET DAILY, Disp: 90 tablet, Rfl: 1 .  MULTIPLE VITAMIN PO, Take by mouth., Disp: , Rfl:  .  simvastatin (ZOCOR) 20 MG tablet, TAKE 1 TABLET BY MOUTH EVERY EVENING, Disp: 90 tablet, Rfl: 1   Patient Care Team: Margaretann Loveless, PA-C as PCP - General (Family Medicine)      Objective:   Vitals: BP 132/80 (BP Location: Left Arm, Patient Position: Sitting, Cuff Size: Normal)   Pulse (!) 108   Temp  98.3 F (36.8 C) (Oral)   Resp 16   Ht 5\' 3"  (1.6 m)   Wt 247 lb (112 kg)   BMI 43.75 kg/m    Vitals:   08/30/17 1519  BP: 132/80  Pulse: (!) 108  Resp: 16  Temp: 98.3 F (36.8 C)  TempSrc: Oral  Weight: 247 lb (112 kg)  Height: 5\' 3"  (1.6 m)     Physical Exam  Constitutional: She is oriented to person, place, and time. She appears well-developed and well-nourished. No distress.  HENT:  Head: Normocephalic and atraumatic.    Right Ear: Hearing, tympanic membrane, external ear and ear canal normal.  Left Ear: Hearing, tympanic membrane, external ear and ear canal normal.  Nose: Nose normal.  Mouth/Throat: Uvula is midline, oropharynx is clear and moist and mucous membranes are normal. No oropharyngeal exudate.  Eyes: Pupils are equal, round, and reactive to light. Conjunctivae and EOM are normal. Right eye exhibits no discharge. Left eye exhibits no discharge. No scleral icterus.  Neck: Normal range of motion. Neck supple. No JVD present. Carotid bruit is not present. No tracheal deviation present. No thyromegaly present.  Cardiovascular: Normal rate, regular rhythm, normal heart sounds and intact distal pulses. Exam reveals no gallop and no friction rub.  No murmur heard. Pulmonary/Chest: Effort normal and breath sounds normal. No respiratory distress. She has no wheezes. She has no rales. She exhibits no tenderness. Right breast exhibits no inverted nipple, no mass, no nipple discharge, no skin change and no tenderness. Left breast exhibits no inverted nipple, no mass, no nipple discharge, no skin change and no tenderness. No breast tenderness, discharge or bleeding. Breasts are symmetrical.  Abdominal: Soft. Bowel sounds are normal. She exhibits no distension and no mass. There is no tenderness. There is no rebound and no guarding. Hernia confirmed negative in the right inguinal area and confirmed negative in the left inguinal area.  Genitourinary: Rectum normal, vagina normal  and uterus normal. No breast tenderness, discharge or bleeding. Pelvic exam was performed with patient supine. There is no rash, tenderness, lesion or injury on the right labia. There is no rash, tenderness, lesion or injury on the left labia. Cervix exhibits no motion tenderness, no discharge and no friability. Right adnexum displays no mass, no tenderness and no fullness. Left adnexum displays no mass, no tenderness and no fullness. No erythema, tenderness or bleeding in the vagina. No signs of injury around the vagina. No vaginal discharge found.  Musculoskeletal: Normal range of motion. She exhibits no edema.       Right shoulder: She  exhibits tenderness (tender to palpation over right AC joint) and spasm. She exhibits normal range of motion, no bony tenderness, normal pulse and normal strength.  Negative empty can, negative drop arm test, positive hawkins impingement  Lymphadenopathy:    She has no cervical adenopathy.       Right: No inguinal adenopathy present.       Left: No inguinal adenopathy present.  Neurological: She is alert and oriented to person, place, and time. She has normal reflexes. No cranial nerve deficit. Coordination normal.  Skin: Skin is warm and dry. No rash noted. She is not diaphoretic.  Psychiatric: She has a normal mood and affect. Her behavior is normal. Judgment and thought content normal.  Vitals reviewed.    Depression Screen PHQ 2/9 Scores 05/02/2017 04/10/2016  PHQ - 2 Score 0 1  PHQ- 9 Score 3 4      Assessment & Plan:     Routine Health Maintenance and Physical Exam  Exercise Activities and Dietary recommendations Goals   None     Immunization History  Administered Date(s) Administered  . Influenza-Unspecified 10/19/2014, 10/15/2016    Health Maintenance  Topic Date Due  . HIV Screening  05/15/1983  . TETANUS/TDAP  05/15/1987  . INFLUENZA VACCINE  08/09/2017  . PAP SMEAR  03/26/2019     Discussed health benefits of physical  activity, and encouraged her to engage in regular exercise appropriate for her age and condition.    1. Annual physical exam Normal physical exam today. Will check labs as below and f/u pending lab results. If labs are stable and WNL she will not need to have these rechecked for one year at her next annual physical exam. She is to call the office in the meantime if she has any acute issue, questions or concerns. - CBC with Differential/Platelet - Comprehensive metabolic panel - Hemoglobin A1c - Lipid panel - TSH  2. Breast cancer screening Breast exam today was normal. There is no family history of breast cancer. She does perform regular self breast exams. Mammogram was ordered as below. [facility] patient so she may schedule her mammogram at her convenience. - MM 3D SCREEN BREAST BILATERAL; Future  3. Cervical cancer screening Pap collected today. Will send as below and f/u pending results. - Cytology - PAP  4. Hypercholesterolemia with hypertriglyceridemia Stable on simvastatin 20mg . Will check labs as below and f/u pending results. - CBC with Differential/Platelet - Comprehensive metabolic panel - Hemoglobin A1c - Lipid panel - TSH  5. Prediabetes Diet controlled. Will check labs as below and f/u pending results. - CBC with Differential/Platelet - Comprehensive metabolic panel - Hemoglobin A1c - Lipid panel - TSH  6. Essential hypertension Stabel on Losartan-hctz 100-25mg . Will check labs as below and f/u pending results. - CBC with Differential/Platelet - Comprehensive metabolic panel - Hemoglobin A1c - Lipid panel - TSH  7. Infected cyst of skin Finished doxycycline yesterday but cyst is not fully resolved. Will continue treatment a little longer.  - doxycycline (VIBRA-TABS) 100 MG tablet; Take 1 tablet (100 mg total) by mouth 2 (two) times daily.  Dispense: 20 tablet; Refill: 0  8. Shoulder impingement syndrome, right Will  treat with medrol dose pak as below. Exercises printed for patient. If this does not resolve pain will consider direct steroid injection and xray. She is in agreement.  - methylPREDNISolone (MEDROL) 4 MG TBPK tablet; 6 day taper; take as directed on package instructions  Dispense: 21  tablet; Refill: 0  --------------------------------------------------------------------    Margaretann Loveless, PA-C  [facility]

## 2017-08-31 ENCOUNTER — Telehealth: Payer: Self-pay

## 2017-08-31 LAB — COMPREHENSIVE METABOLIC PANEL
A/G RATIO: 1.6 (ref 1.2–2.2)
ALK PHOS: 76 IU/L (ref 39–117)
ALT: 16 IU/L (ref 0–32)
AST: 17 IU/L (ref 0–40)
Albumin: 4.4 g/dL (ref 3.5–5.5)
BUN/Creatinine Ratio: 27 — ABNORMAL HIGH (ref 9–23)
BUN: 15 mg/dL (ref 6–24)
CHLORIDE: 99 mmol/L (ref 96–106)
CO2: 23 mmol/L (ref 20–29)
Calcium: 9.5 mg/dL (ref 8.7–10.2)
Creatinine, Ser: 0.56 mg/dL — ABNORMAL LOW (ref 0.57–1.00)
GFR calc non Af Amer: 110 mL/min/{1.73_m2} (ref >59)
GFR, EST AFRICAN AMERICAN: 127 mL/min/{1.73_m2} (ref >59)
GLUCOSE: 141 mg/dL — AB (ref 65–99)
Globulin, Total: 2.7 g/dL (ref 1.5–4.5)
POTASSIUM: 3.7 mmol/L (ref 3.5–5.2)
Sodium: 140 mmol/L (ref 134–144)
Total Protein: 7.1 g/dL (ref 6.0–8.5)

## 2017-08-31 LAB — LIPID PANEL
CHOL/HDL RATIO: 3.4 ratio (ref 0.0–4.4)
CHOLESTEROL TOTAL: 185 mg/dL (ref 100–199)
HDL: 54 mg/dL (ref >39)
LDL CALC: 56 mg/dL (ref 0–99)
Triglycerides: 374 mg/dL — ABNORMAL HIGH (ref 0–149)
VLDL Cholesterol Cal: 75 mg/dL — ABNORMAL HIGH (ref 5–40)

## 2017-08-31 LAB — CBC WITH DIFFERENTIAL/PLATELET
BASOS: 0 %
Basophils Absolute: 0 10*3/uL (ref 0.0–0.2)
EOS (ABSOLUTE): 0.3 10*3/uL (ref 0.0–0.4)
Eos: 3 %
Hematocrit: 36.4 % (ref 34.0–46.6)
Hemoglobin: 12.3 g/dL (ref 11.1–15.9)
IMMATURE GRANS (ABS): 0.1 10*3/uL (ref 0.0–0.1)
Immature Granulocytes: 1 %
LYMPHS ABS: 3.4 10*3/uL — AB (ref 0.7–3.1)
LYMPHS: 25 %
MCH: 30.5 pg (ref 26.6–33.0)
MCHC: 33.8 g/dL (ref 31.5–35.7)
MCV: 90 fL (ref 79–97)
MONOS ABS: 0.7 10*3/uL (ref 0.1–0.9)
Monocytes: 5 %
Neutrophils Absolute: 8.7 10*3/uL — ABNORMAL HIGH (ref 1.4–7.0)
Neutrophils: 66 %
PLATELETS: 403 10*3/uL (ref 150–450)
RBC: 4.03 x10E6/uL (ref 3.77–5.28)
RDW: 13.6 % (ref 12.3–15.4)
WBC: 13.2 10*3/uL — ABNORMAL HIGH (ref 3.4–10.8)

## 2017-08-31 LAB — HEMOGLOBIN A1C
Est. average glucose Bld gHb Est-mCnc: 134 mg/dL
HEMOGLOBIN A1C: 6.3 % — AB (ref 4.8–5.6)

## 2017-08-31 LAB — TSH: TSH: 1.56 u[IU]/mL (ref 0.450–4.500)

## 2017-08-31 NOTE — Telephone Encounter (Signed)
-----   Message from Margaretann LovelessJennifer M Burnette, [place]PA-C sent at 08/31/2017  1:49 PM EDT ----- Cliffton AstersWhite cell count is up some which is secondary to the infection. Kidney function and liver function are normal. Sodium and potassium are normal. A1c is stable at 6.3. Cholesterol doing well. Triglycerides were elevated but this is secondary to non-fasting labs. Thyroid is normal.

## 2017-08-31 NOTE — Telephone Encounter (Signed)
LMTCB  Thanks,  -Keerthana Vanrossum 

## 2017-09-03 ENCOUNTER — Telehealth: Payer: Self-pay

## 2017-09-03 LAB — CYTOLOGY - PAP
Adequacy: ABSENT
DIAGNOSIS: NEGATIVE
HPV: NOT DETECTED

## 2017-09-03 NOTE — Telephone Encounter (Signed)
-----   Message from Margaretann LovelessJennifer M Burnette, [place]PA-C sent at 09/03/2017  1:56 PM EDT ----- Pap is normal, HPV negative.  Will repeat in 3-5 years.

## 2017-09-03 NOTE — Telephone Encounter (Signed)
LMTCB

## 2017-09-03 NOTE — Telephone Encounter (Signed)
Viewed by Elesa HackerHeather Leigh Holley Hall on 09/02/2017 10:21 AM

## 2017-09-04 NOTE — Telephone Encounter (Signed)
Left detailed message on voice mail, per DPR.

## 2017-10-16 ENCOUNTER — Ambulatory Visit
Admission: RE | Admit: 2017-10-16 | Discharge: 2017-10-16 | Disposition: A | Discharge status: Home / Self Care | Payer: BC Managed Care – PPO | Source: Ambulatory Visit | Attending: Physician Assistant | Admitting: Physician Assistant

## 2017-10-16 DIAGNOSIS — Z1239 Encounter for other screening for malignant neoplasm of breast: Secondary | ICD-10-CM | POA: Diagnosis present

## 2017-11-07 ENCOUNTER — Encounter: Payer: Self-pay | Admitting: Podiatry

## 2017-11-07 ENCOUNTER — Ambulatory Visit: Payer: BC Managed Care – PPO | Admitting: Podiatry

## 2017-11-07 DIAGNOSIS — M779 Enthesopathy, unspecified: Secondary | ICD-10-CM | POA: Diagnosis not present

## 2017-11-07 DIAGNOSIS — M778 Other enthesopathies, not elsewhere classified: Secondary | ICD-10-CM

## 2017-11-07 DIAGNOSIS — M7751 Other enthesopathy of right foot: Secondary | ICD-10-CM

## 2017-11-07 MED ORDER — METHYLPREDNISOLONE 4 MG PO TBPK: ORAL_TABLET | ORAL | 0 refills | Status: DC

## 2017-11-07 NOTE — Progress Notes (Signed)
She presents today for follow-up of her capsulitis of her second metatarsal phalangeal joint states that he has begun to become more painful in the anterolateral aspect of her right foot.  She states that the Medrol Dosepak the Aleve that she takes in the injections were performed last time worked for quite some time she understands that she is going to have to have surgery and just does not want to have to have it yet.  Objective: Vital signs are stable she is alert and oriented x3.  Pulses are palpable.  Still has pain on palpation and end range of motion of the second metatarsal phalangeal joint.  She also has pain on end range of motion of the subtalar joint with palpation of the sinus tarsi.  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  Plan: At this point after sterile Betadine skin prep I injected the subtalar joint with 20 mg Kenalog and local anesthetic.  Also injected around the second metatarsal phalangeal joint with 10 mg of Kenalog 5 mg Marcaine.  We discussed appropriate shoe gear stretching exercise ice therapy the need for some surgical intervention and I will follow-up with her as needed.

## 2017-11-26 ENCOUNTER — Ambulatory Visit: Payer: BC Managed Care – PPO | Admitting: Physician Assistant

## 2017-11-26 ENCOUNTER — Encounter: Payer: Self-pay | Admitting: Physician Assistant

## 2017-11-26 VITALS — BP 145/76 | HR 99 | Temp 97.9°F | Resp 16 | Wt 241.6 lb

## 2017-11-26 DIAGNOSIS — L0292 Furuncle, unspecified: Secondary | ICD-10-CM

## 2017-11-26 MED ORDER — DOXYCYCLINE HYCLATE 100 MG PO TABS: 100.0000 mg | ORAL_TABLET | Freq: Two times a day (BID) | ORAL | 0 refills | Status: DC

## 2017-11-26 NOTE — Progress Notes (Signed)
       Patient: Erin Frye Female    DOB: 11/11/1970   47 y.o.   MRN: [id number] Visit Date: 11/26/2017  Today's Provider: Margaretann LovelessJennifer M Thijs Brunton, PA-C   Chief Complaint  Patient presents with  . Cyst   Subjective:    HPI Patient here today with a cyst on her front hairline. She reports that is hot around it and spreading. The cyst is red and draining. She reports it has been draining since Saturday. She reports that it started like a pimple. She had one on the posterior hairline that became severely infected just a few months previously. Was successfully treated with doxycycline.      Allergies  Allergen Reactions  . Codeine     itching     Current Outpatient Medications:  .  ferrous sulfate 325 (65 FE) MG tablet, Take by mouth., Disp: , Rfl:  .  fexofenadine (ALLEGRA) 180 MG tablet, Take by mouth., Disp: , Rfl:  .  fluticasone (FLONASE ALLERGY RELIEF) 50 MCG/ACT nasal spray, Place into the nose., Disp: , Rfl:  .  losartan-hydrochlorothiazide (HYZAAR) 100-25 MG tablet, TAKE 1 TABLET DAILY, Disp: 90 tablet, Rfl: 1 .  MULTIPLE VITAMIN PO, Take by mouth., Disp: , Rfl:  .  simvastatin (ZOCOR) 20 MG tablet, TAKE 1 TABLET BY MOUTH EVERY EVENING, Disp: 90 tablet, Rfl: 1  Review of Systems  Constitutional: Negative.   Respiratory: Negative.   Cardiovascular: Negative.   Gastrointestinal: Negative.   Skin: Positive for wound.  Neurological: Negative.     Social History   Tobacco Use  . Smoking status: Never Smoker  . Smokeless tobacco: Never Used  Substance Use Topics  . Alcohol use: Yes    Alcohol/week: 0.0 standard drinks    Comment: rare   Objective:   BP (!) 145/76 (BP Location: Left Wrist, Patient Position: Sitting, Cuff Size: Normal)   Pulse 99   Temp 97.9 F (36.6 C) (Oral)   Resp 16   Wt 241 lb 9.6 oz (109.6 kg)   BMI 42.80 kg/m  Vitals:   11/26/17 1812  BP: (!) 145/76  Pulse: 99  Resp: 16  Temp: 97.9 F (36.6 C)  TempSrc: Oral  Weight:  241 lb 9.6 oz (109.6 kg)     Physical Exam  Constitutional: She appears well-developed and well-nourished. No distress.  Neck: Normal range of motion. Neck supple.  Cardiovascular: Normal rate, regular rhythm and normal heart sounds. Exam reveals no gallop and no friction rub.  No murmur heard. Pulmonary/Chest: Effort normal and breath sounds normal. No respiratory distress. She has no wheezes. She has no rales.  Skin: Rash noted. Rash is pustular. She is not diaphoretic.     Vitals reviewed.       Assessment & Plan:     1. Boil Suspect infected dermoid cyst. Treat with doxycycline as below. Continue warm compress. 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  - doxycycline (VIBRA-TABS) 100 MG tablet; Take 1 tablet (100 mg total) by mouth 2 (two) times daily.  Dispense: 20 tablet; Refill: 0       Margaretann LovelessJennifer M Bannon Giammarco, PA-C  [facility]

## 2017-12-02 ENCOUNTER — Encounter: Payer: Self-pay | Admitting: Physician Assistant

## 2018-01-21 ENCOUNTER — Other Ambulatory Visit: Payer: Self-pay | Admitting: Physician Assistant

## 2018-01-21 DIAGNOSIS — I10 Essential (primary) hypertension: Secondary | ICD-10-CM

## 2018-02-10 ENCOUNTER — Other Ambulatory Visit: Payer: Self-pay | Admitting: Physician Assistant

## 2018-02-10 DIAGNOSIS — E782 Mixed hyperlipidemia: Secondary | ICD-10-CM

## 2018-02-11 ENCOUNTER — Encounter: Payer: Self-pay | Admitting: Family Medicine

## 2018-02-11 ENCOUNTER — Ambulatory Visit: Payer: BC Managed Care – PPO | Admitting: Family Medicine

## 2018-02-11 ENCOUNTER — Other Ambulatory Visit: Payer: Self-pay

## 2018-02-11 VITALS — BP 124/84 | HR 118 | Temp 98.0°F | Ht 63.0 in | Wt 242.6 lb

## 2018-02-11 DIAGNOSIS — L0293 Carbuncle, unspecified: Secondary | ICD-10-CM

## 2018-02-11 MED ORDER — DOXYCYCLINE HYCLATE 100 MG PO TABS: 100.0000 mg | ORAL_TABLET | Freq: Two times a day (BID) | ORAL | 0 refills | Status: DC

## 2018-02-11 NOTE — Patient Instructions (Signed)
Continue warm compresses. Return in 48 hours if not improving for incision and drainage.

## 2018-02-11 NOTE — Progress Notes (Signed)
  Subjective:     Patient ID: Erin Frye, female   DOB: 1970/12/13, 48 y.o.   MRN: [id number] Chief Complaint  Patient presents with  . abcess on back of neck    on right side back of neck since Sat 02/09/18.  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 Prior  C&S with non MRSA staph. Usually drain on their own and clear with doxycycline. Wishes rx today. Has seen dermatology for this without specific recommendations per her report. A1C 6.3 on 08/30/17.  Review of Systems     Objective:   Physical Exam Constitutional:      General: She is not in acute distress.    Appearance: She is not ill-appearing.  Skin:    Comments: Right posterior scalp in her hairline is an area of erythema, small central eschar, and mild induration. Tender to the touch without drainage.  Neurological:     Mental Status: She is alert.        Assessment:    1. Carbuncle: doxycycline and warm compresses.     Plan:    Return in 48 hours for I & D if not improving.

## 2018-03-25 ENCOUNTER — Encounter: Payer: Self-pay | Admitting: Podiatry

## 2018-03-25 ENCOUNTER — Ambulatory Visit: Payer: BC Managed Care – PPO | Admitting: Podiatry

## 2018-03-25 ENCOUNTER — Other Ambulatory Visit: Payer: Self-pay

## 2018-03-25 DIAGNOSIS — M2142 Flat foot [pes planus] (acquired), left foot: Secondary | ICD-10-CM | POA: Diagnosis not present

## 2018-03-25 DIAGNOSIS — M779 Enthesopathy, unspecified: Secondary | ICD-10-CM | POA: Diagnosis not present

## 2018-03-25 DIAGNOSIS — M778 Other enthesopathies, not elsewhere classified: Secondary | ICD-10-CM

## 2018-03-25 DIAGNOSIS — M2141 Flat foot [pes planus] (acquired), right foot: Secondary | ICD-10-CM

## 2018-03-25 NOTE — Progress Notes (Signed)
She presents today for continued pain in her forefoot beneath the second metatarsal phalangeal joint and in the plantar and dorsal arch area around the first and second metatarsals.  She states that the pain is sort of aches all the way through the foot.  Objective: Vital signs are stable she is alert oriented x3 reproducible pain on palpation and range of motion of the second metatarsal phalangeal joint and frontal plane range of motion and palpation of the first and second TMT.  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  Plan: Discussed etiology pathology conservative surgical therapies at this point in time I highly recommended orthotics and we will try that before another set of injections.  If that fails to alleviate her symptoms she is fully prepared for surgical intervention.

## 2018-04-03 ENCOUNTER — Other Ambulatory Visit: Payer: BC Managed Care – PPO | Admitting: Orthotics

## 2018-04-26 ENCOUNTER — Encounter: Payer: Self-pay | Admitting: Physician Assistant

## 2018-04-29 ENCOUNTER — Ambulatory Visit (INDEPENDENT_AMBULATORY_CARE_PROVIDER_SITE_OTHER): Payer: BC Managed Care – PPO | Admitting: Physician Assistant

## 2018-04-29 ENCOUNTER — Encounter: Payer: Self-pay | Admitting: Physician Assistant

## 2018-04-29 VITALS — Wt 242.0 lb

## 2018-04-29 DIAGNOSIS — G479 Sleep disorder, unspecified: Secondary | ICD-10-CM | POA: Insufficient documentation

## 2018-04-29 MED ORDER — TRAZODONE HCL 50 MG PO TABS: 25.0000 mg | ORAL_TABLET | Freq: Every evening | ORAL | 1 refills | Status: DC | PRN

## 2018-04-29 NOTE — Patient Instructions (Signed)
Trazodone tablets What is this medicine? TRAZODONE (TRAZ oh done) is used to treat depression. This medicine may be used for other purposes; ask your health care provider or pharmacist if you have questions. COMMON BRAND NAME(S): Desyrel What should I tell my health care provider before I take this medicine? They need to know if you have any of these conditions: -attempted suicide or thinking about it -bipolar disorder -bleeding problems -glaucoma -heart disease, or previous heart attack -irregular heart beat -kidney or liver disease -low levels of sodium in the blood -an unusual or allergic reaction to trazodone, other medicines, foods, dyes or preservatives -pregnant or trying to get pregnant -breast-feeding How should I use this medicine? Take this medicine by mouth with a glass of water. Follow the directions on the prescription label. Take this medicine shortly after a meal or a light snack. Take your medicine at regular intervals. Do not take your medicine more often than directed. Do not stop taking this medicine suddenly except upon the advice of your doctor. Stopping this medicine too quickly may cause serious side effects or your condition may worsen. A special MedGuide will be given to you by the pharmacist with each prescription and refill. Be sure to read this information carefully each time. Talk to your pediatrician regarding the use of this medicine in children. Special care may be needed. Overdosage: If you think you have taken too much of this medicine contact a poison control center or emergency room at once. NOTE: This medicine is only for you. Do not share this medicine with others. What if I miss a dose? If you miss a dose, take it as soon as you can. If it is almost time for your next dose, take only that dose. Do not take double or extra doses. What may interact with this medicine? Do not take this medicine with any of the following medications: -certain medicines  for fungal infections like fluconazole, itraconazole, ketoconazole, posaconazole, voriconazole -cisapride -dofetilide -dronedarone -linezolid -MAOIs like Carbex, Eldepryl, Marplan, Nardil, and Parnate -mesoridazine -methylene blue (injected into a vein) -pimozide -saquinavir -thioridazine This medicine may also interact with the following medications: -alcohol -antiviral medicines for HIV or AIDS -aspirin and aspirin-like medicines -barbiturates like phenobarbital -certain medicines for blood pressure, heart disease, irregular heart beat -certain medicines for depression, anxiety, or psychotic disturbances -certain medicines for migraine headache like almotriptan, eletriptan, frovatriptan, naratriptan, rizatriptan, sumatriptan, zolmitriptan -certain medicines for seizures like carbamazepine and phenytoin -certain medicines for sleep -certain medicines that treat or prevent blood clots like dalteparin, enoxaparin, warfarin -digoxin -fentanyl -lithium -NSAIDS, medicines for pain and inflammation, like ibuprofen or naproxen -other medicines that prolong the QT interval (cause an abnormal heart rhythm) -rasagiline -supplements like St. John's wort, kava kava, valerian -tramadol -tryptophan This list may not describe all possible interactions. Give your health care provider a list of all the medicines, herbs, non-prescription drugs, or dietary supplements you use. Also tell them if you smoke, drink alcohol, or use illegal drugs. Some items may interact with your medicine. What should I watch for while using this medicine? Tell your doctor if your symptoms do not get better or if they get worse. Visit your doctor or health care professional for regular checks on your progress. Because it may take several weeks to see the full effects of this medicine, it is important to continue your treatment as prescribed by your doctor. Patients and their families should watch out for new or worsening  thoughts of suicide or depression. Also watch   out for sudden changes in feelings such as feeling anxious, agitated, panicky, irritable, hostile, aggressive, impulsive, severely restless, overly excited and hyperactive, or not being able to sleep. If this happens, especially at the beginning of treatment or after a change in dose, call your health care professional. You may get drowsy or dizzy. Do not drive, use machinery, or do anything that needs mental alertness until you know how this medicine affects you. Do not stand or sit up quickly, especially if you are an older patient. This reduces the risk of dizzy or fainting spells. Alcohol may interfere with the effect of this medicine. Avoid alcoholic drinks. This medicine may cause dry eyes and blurred vision. If you wear contact lenses you may feel some discomfort. Lubricating drops may help. See your eye doctor if the problem does not go away or is severe. Your mouth may get dry. Chewing sugarless gum, sucking hard candy and drinking plenty of water may help. Contact your doctor if the problem does not go away or is severe. What side effects may I notice from receiving this medicine? Side effects that you should report to your doctor or health care professional as soon as possible: -allergic reactions like skin rash, itching or hives, swelling of the face, lips, or tongue -elevated mood, decreased need for sleep, racing thoughts, impulsive behavior -confusion -fast, irregular heartbeat -feeling faint or lightheaded, falls -feeling agitated, angry, or irritable -loss of balance or coordination -painful or prolonged erections -restlessness, pacing, inability to keep still -suicidal thoughts or other mood changes -tremors -trouble sleeping -seizures -unusual bleeding or bruising Side effects that usually do not require medical attention (report to your doctor or health care professional if they continue or are bothersome): -change in sex drive or  performance -change in appetite or weight -constipation -headache -muscle aches or pains -nausea This list may not describe all possible side effects. Call your doctor for medical advice about side effects. You may report side effects to FDA at 1-800-FDA-1088. Where should I keep my medicine? Keep out of the reach of children. Store at room temperature between 15 and 30 degrees C (59 to 86 degrees F). Protect from light. Keep container tightly closed. Throw away any unused medicine after the expiration date. NOTE: This sheet is a summary. It may not cover all possible information. If you have questions about this medicine, talk to your doctor, pharmacist, or health care provider.  2019 Elsevier/Gold Standard (2017-03-06 17:51:24)  

## 2018-04-29 NOTE — Progress Notes (Signed)
Virtual Visit via Video Note  I connected with Elesa Hacker on 04/29/18 at  1:20 PM EDT by a video enabled telemedicine application and verified that I am speaking with the correct person using two identifiers.   I discussed the limitations of evaluation and management by telemedicine and the availability of in person appointments. The patient expressed understanding and agreed to proceed.  Patient location: home Provider location: [facility] Persons involved in the visit: patient, provider, CMA  Margaretann Loveless, PA-C   Patient: Erin Frye Female    DOB: Jul 12, 1970   48 y.o.   MRN: [id number] Visit Date: 04/29/2018  Today's Provider: Margaretann Loveless, PA-C   No chief complaint on file.  Subjective:     Insomnia  Primary symptoms: sleep disturbance, difficulty falling asleep, frequent awakening.  The current episode started more than one month. The onset quality is gradual. The problem occurs nightly. The problem has been gradually worsening since onset. The symptoms are aggravated by bed partner (bed partner-snores). Treatments tried: Melatonin for years but is not working anymore. Typical bedtime:  10-11 P.M. (Goes to sleep at 11pm and wakes up 4:45am with waking up in between).       Allergies  Allergen Reactions  . Codeine     itching     Current Outpatient Medications:  .  ferrous sulfate 325 (65 FE) MG tablet, Take by mouth., Disp: , Rfl:  .  fexofenadine (ALLEGRA) 180 MG tablet, Take by mouth., Disp: , Rfl:  .  fluticasone (FLONASE ALLERGY RELIEF) 50 MCG/ACT nasal spray, Place into the nose., Disp: , Rfl:  .  losartan-hydrochlorothiazide (HYZAAR) 100-25 MG tablet, TAKE 1 TABLET DAILY, Disp: 90 tablet, Rfl: 1 .  MULTIPLE VITAMIN PO, Take by mouth., Disp: , Rfl:  .  simvastatin (ZOCOR) 20 MG tablet, TAKE 1 TABLET BY MOUTH EVERY EVENING, Disp: 90 tablet, Rfl: 1  Review of Systems  Constitutional: Negative.    Respiratory: Negative.   Cardiovascular: Negative.   Musculoskeletal: Negative.   Neurological: Negative.   Psychiatric/Behavioral: Positive for sleep disturbance. The patient is nervous/anxious and has insomnia.     Social History   Tobacco Use  . Smoking status: Never Smoker  . Smokeless tobacco: Never Used  Substance Use Topics  . Alcohol use: Yes    Alcohol/week: 0.0 standard drinks    Comment: rare      Objective:   Wt 242 lb (109.8 kg)   BMI 42.87 kg/m  Vitals:   04/29/18 1102  Weight: 242 lb (109.8 kg)     Physical Exam Vitals signs reviewed.  Constitutional:      General: She is not in acute distress.    Appearance: Normal appearance. She is well-developed. She is not ill-appearing.  HENT:     Head: Normocephalic and atraumatic.  Neck:     Musculoskeletal: Normal range of motion and neck supple.  Pulmonary:     Effort: Pulmonary effort is normal. No respiratory distress.  Neurological:     Mental Status: She is alert.  Psychiatric:        Mood and Affect: Mood normal.        Behavior: Behavior normal.        Thought Content: Thought content normal.        Judgment: Judgment normal.        Assessment & Plan    1. Difficulty sleeping Has used Melatonin for many years, now not as effective. Has  never used any other OTC medications. Having increased anxiety with Covid pandemic that is causing her to have racing thoughts when she goes to bed. Discussed sleep hygiene and sleep meditation techniques. Will try trazodone as below. I will see her back in 4 weeks (evisit) for f/u.  - traZODone (DESYREL) 50 MG tablet; Take 0.5-1 tablets (25-50 mg total) by mouth at bedtime as needed for sleep.  Dispense: 30 tablet; Refill: 1    I discussed the assessment and treatment plan with the patient. The patient was provided an opportunity to ask questions and all were answered. The patient agreed with the plan and demonstrated an understanding of the instructions.    The patient was advised to call back or seek an in-person evaluation if the symptoms worsen or if the condition fails to improve as anticipated.  I provided 16 minutes of non-face-to-face time during this encounter.  Margaretann LovelessJennifer M Kyrah Schiro, PA-C  [facility]

## 2018-04-29 NOTE — Assessment & Plan Note (Signed)
Has tried melatonin and failed. No other OTC tried. Will start Trazodone 50mg  q hs prn.

## 2018-05-02 ENCOUNTER — Other Ambulatory Visit: Payer: Self-pay

## 2018-05-02 ENCOUNTER — Ambulatory Visit
Admission: EM | Admit: 2018-05-02 | Discharge: 2018-05-02 | Disposition: A | Discharge status: Home / Self Care | Payer: BC Managed Care – PPO | Attending: Emergency Medicine | Admitting: Emergency Medicine

## 2018-05-02 ENCOUNTER — Encounter: Payer: Self-pay | Admitting: Emergency Medicine

## 2018-05-02 DIAGNOSIS — M79604 Pain in right leg: Secondary | ICD-10-CM

## 2018-05-02 DIAGNOSIS — W182XXA Fall in (into) shower or empty bathtub, initial encounter: Secondary | ICD-10-CM | POA: Diagnosis not present

## 2018-05-02 DIAGNOSIS — S76301A Unspecified injury of muscle, fascia and tendon of the posterior muscle group at thigh level, right thigh, initial encounter: Secondary | ICD-10-CM

## 2018-05-02 MED ORDER — TRAMADOL HCL 50 MG PO TABS: 50.0000 mg | ORAL_TABLET | Freq: Three times a day (TID) | ORAL | 0 refills | Status: DC | PRN

## 2018-05-02 MED ORDER — CYCLOBENZAPRINE HCL 10 MG PO TABS: 10.0000 mg | ORAL_TABLET | Freq: Every evening | ORAL | 0 refills | Status: DC | PRN

## 2018-05-02 MED ORDER — MELOXICAM 15 MG PO TABS: 15.0000 mg | ORAL_TABLET | Freq: Every day | ORAL | 0 refills | Status: DC | PRN

## 2018-05-02 NOTE — Discharge Instructions (Signed)
Take medication as prescribed. As discussed, do not take Flexeril and tramadol together. Rest. Drink plenty of fluids. Ice. Gradually stretch as tolerated.   Follow up with orthopedic for continued pain.   Follow up with your primary care physician this week as needed. Return to Urgent care for new or worsening concerns.

## 2018-05-02 NOTE — ED Triage Notes (Signed)
Patient states that she slipped in the shower this morning and felt pain in the back of the right thigh.  Patient denies hitting her head.

## 2018-05-02 NOTE — ED Provider Notes (Signed)
[facility] ____________________________________________  Time seen: Approximately 10:52 AM  I have reviewed the triage vital signs and the nursing notes.   HISTORY  Chief Complaint Fall and Leg Pain (right)   HPI Erin Frye is a 48 y.o. female presenting for evaluation of right posterior thigh pain after injury that occurred approximately an hour prior to arrival.  Patient states that she was in the shower shaving her right leg with right leg propped up on side of tub, and her left leg slipped causing her to twist awkwardly.  States she did fall forward but caught herself with her hands.  States her right upper leg did not directly hit the tub or the floor, no direct trauma.  States she has had pain to right posterior thigh since.  States pain is worse with movement and improves with standing completely still.  Denies paresthesias, bruising, swelling, pain radiation.  States has a hard time moving leg backwards and forwards.  No hip pain, knee pain, back pain, head injury or loss of consciousness.  Did take 2 Tylenol prior to arrival.  Denies other alleviating measures.  Denies other aggravating factors.  Reports otherwise doing well.  No recent sickness.  Denies recent cough, congestion or shortness of breath.  Denies renal insufficiency.  Margaretann Loveless, PA-C: PCP    Past Medical History:  Diagnosis Date  . Hypertension     Patient Active Problem List   Diagnosis Date Noted  . Difficulty sleeping 04/29/2018  . Primary osteoarthritis of both knees 03/30/2015  . Hypercholesterolemia with hypertriglyceridemia 02/12/2015  . Perimenopausal 02/12/2015  . Prediabetes 02/12/2015  . Allergic rhinitis 08/24/2014  . Absolute anemia 08/24/2014  . Essential hypertension 08/20/2014    Past Surgical History:  Procedure Laterality Date  . CESAREAN SECTION  2004  . GALLBLADDER SURGERY  2007     No current facility-administered medications for this  encounter.   Current Outpatient Medications:  .  ferrous sulfate 325 (65 FE) MG tablet, Take by mouth., Disp: , Rfl:  .  fexofenadine (ALLEGRA) 180 MG tablet, Take by mouth., Disp: , Rfl:  .  fluticasone (FLONASE ALLERGY RELIEF) 50 MCG/ACT nasal spray, Place into the nose., Disp: , Rfl:  .  losartan-hydrochlorothiazide (HYZAAR) 100-25 MG tablet, TAKE 1 TABLET DAILY, Disp: 90 tablet, Rfl: 1 .  MULTIPLE VITAMIN PO, Take by mouth., Disp: , Rfl:  .  simvastatin (ZOCOR) 20 MG tablet, TAKE 1 TABLET BY MOUTH EVERY EVENING, Disp: 90 tablet, Rfl: 1 .  traZODone (DESYREL) 50 MG tablet, Take 0.5-1 tablets (25-50 mg total) by mouth at bedtime as needed for sleep., Disp: 30 tablet, Rfl: 1 .  cyclobenzaprine (FLEXERIL) 10 MG tablet, Take 1 tablet (10 mg total) by mouth at bedtime as needed for muscle spasms. Do not drive while taking as can cause drowsiness, Disp: 15 tablet, Rfl: 0 .  meloxicam (MOBIC) 15 MG tablet, Take 1 tablet (15 mg total) by mouth daily as needed., Disp: 10 tablet, Rfl: 0 .  traMADol (ULTRAM) 50 MG tablet, Take 1 tablet (50 mg total) by mouth every 8 (eight) hours as needed., Disp: 12 tablet, Rfl: 0  Allergies Codeine  Family History  Problem Relation Age of Onset  . Diabetes Mother   . Breast cancer Mother   . Healthy Brother   . Breast cancer Maternal Grandmother     Social History Social History   Tobacco Use  . Smoking status: Never Smoker  . Smokeless tobacco: Never Used  Substance  Use Topics  . Alcohol use: Yes    Alcohol/week: 0.0 standard drinks    Comment: rare  . Drug use: No    Review of Systems Constitutional: No fever Cardiovascular: Denies chest pain. Respiratory: Denies shortness of breath. Gastrointestinal: No abdominal pain.   Musculoskeletal: Negative for back pain.  Positive right posterior leg pain. Skin: Negative for rash.   ____________________________________________   PHYSICAL EXAM:  VITAL SIGNS: ED Triage Vitals  Enc Vitals Group      BP 05/02/18 1001 118/77     Pulse Rate 05/02/18 1001 (!) 118  [id number]     Resp 05/02/18 1001 16     Temp 05/02/18 1001 97.6 F (36.4 C)     Temp Source 05/02/18 1001 Oral     SpO2 05/02/18 1001 99 %     Weight 05/02/18 0958 242 lb (109.8 kg)     Height 05/02/18 0958 5\' 3"  (1.6 m)     Head Circumference --      Peak Flow --      Pain Score 05/02/18 0958 5     Pain Loc --      Pain Edu? --      Excl. in GC? --     Constitutional: Alert and oriented. Well appearing and in no acute distress. ENT      Head: Normocephalic and atraumatic. Cardiovascular: Normal rate, regular rhythm. Grossly normal heart sounds.  Good peripheral circulation. Respiratory: Normal respiratory effort without tachypnea nor retractions. Breath sounds are clear and equal bilaterally. No wheezes, rales, rhonchi.ness. Musculoskeletal:  Bilateral pedal pulses equal and easily palpated.   Except: Right posterior thigh along hamstring mild to moderate tenderness to direct palpation, pain increases with posterior leg abduction and knee lift, no point bony tenderness, no anterior leg tenderness, no hip or knee tenderness, right lower extremity nontender, no ecchymosis, no saddle anesthesia, no paresthesia, able to weight-bear fully on right leg, pain fully reproducible by direct palpation and active range of motion for patient. Neurologic:  Normal speech and language. No gross focal neurologic deficits are appreciated. Speech is normal. No gait instability.  Skin:  Skin is warm, dry and intact. No rash noted. Psychiatric: Mood and affect are normal. Speech and behavior are normal. Patient exhibits appropriate insight and judgment   ___________________________________________   LABS (all labs ordered are listed, but only abnormal results are displayed)  Labs Reviewed - No data to display ____________________________________________   PROCEDURES Procedures    INITIAL IMPRESSION / ASSESSMENT AND PLAN / ED  COURSE  Pertinent labs & imaging results that were available during my care of the patient were reviewed by me and considered in my medical decision making (see chart for details).  Well-appearing patient.  No acute distress.  Right posterior thigh pain post twisting and slipping injury in shower this morning.  No bony tenderness.  Will defer x-ray, patient agrees.  Suspect hamstring injury, likely strain.  Recommend ice and supportive care.  Will treat with daily Mobic, PRN tramadol as needed for breakthrough pain and Flexeril at night, discussed not taking Flexeril and tramadol together.  Gradual increase of activity as tolerated.  Follow-up with orthopedic in 1 week no improvement, discussed and return parameters.  [place] controlled substance database reviewed, no recent controlled substances documented.Discussed indication, risks and benefits of medications with patient.  Discussed follow up with Primary care physician this week. Discussed follow up and return parameters including no resolution or any worsening concerns. Patient verbalized understanding and agreed to  plan.   ____________________________________________   FINAL CLINICAL IMPRESSION(S) / ED DIAGNOSES  Final diagnoses:  Right leg pain  Right hamstring injury, initial encounter     ED Discharge Orders         Ordered    meloxicam (MOBIC) 15 MG tablet  Daily PRN     05/02/18 1031    traMADol (ULTRAM) 50 MG tablet  Every 8 hours PRN     05/02/18 1031    cyclobenzaprine (FLEXERIL) 10 MG tablet  At bedtime PRN     05/02/18 1031           Note: This dictation was prepared with Dragon dictation along with smaller phrase technology. Any transcriptional errors that result from this process are unintentional.         Renford Dills, NP 05/02/18 1058

## 2018-05-15 ENCOUNTER — Other Ambulatory Visit: Payer: BC Managed Care – PPO | Admitting: Orthotics

## 2018-05-22 ENCOUNTER — Other Ambulatory Visit: Payer: Self-pay

## 2018-05-22 ENCOUNTER — Ambulatory Visit: Payer: BC Managed Care – PPO | Admitting: Orthotics

## 2018-05-22 DIAGNOSIS — M2142 Flat foot [pes planus] (acquired), left foot: Secondary | ICD-10-CM

## 2018-05-22 DIAGNOSIS — M2141 Flat foot [pes planus] (acquired), right foot: Secondary | ICD-10-CM

## 2018-05-22 DIAGNOSIS — M7751 Other enthesopathy of right foot: Secondary | ICD-10-CM

## 2018-05-22 NOTE — Progress Notes (Signed)
Patient came into today to be cast for [facility]. Upon recommendation of Dr. 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

## 2018-05-23 ENCOUNTER — Encounter: Payer: Self-pay | Admitting: Physician Assistant

## 2018-05-23 ENCOUNTER — Ambulatory Visit (INDEPENDENT_AMBULATORY_CARE_PROVIDER_SITE_OTHER): Payer: BC Managed Care – PPO | Admitting: Physician Assistant

## 2018-05-23 DIAGNOSIS — S76311D Strain of muscle, fascia and tendon of the posterior muscle group at thigh level, right thigh, subsequent encounter: Secondary | ICD-10-CM

## 2018-05-23 DIAGNOSIS — G479 Sleep disorder, unspecified: Secondary | ICD-10-CM | POA: Diagnosis not present

## 2018-05-23 MED ORDER — TRAZODONE HCL 50 MG PO TABS: 50.0000 mg | ORAL_TABLET | Freq: Every evening | ORAL | 1 refills | Status: DC | PRN

## 2018-05-23 NOTE — Patient Instructions (Signed)
Trazodone tablets What is this medicine? TRAZODONE (TRAZ oh done) is used to treat depression. This medicine may be used for other purposes; ask your health care provider or pharmacist if you have questions. COMMON BRAND NAME(S): Desyrel What should I tell my health care provider before I take this medicine? They need to know if you have any of these conditions: -attempted suicide or thinking about it -bipolar disorder -bleeding problems -glaucoma -heart disease, or previous heart attack -irregular heart beat -kidney or liver disease -low levels of sodium in the blood -an unusual or allergic reaction to trazodone, other medicines, foods, dyes or preservatives -pregnant or trying to get pregnant -breast-feeding How should I use this medicine? Take this medicine by mouth with a glass of water. Follow the directions on the prescription label. Take this medicine shortly after a meal or a light snack. Take your medicine at regular intervals. Do not take your medicine more often than directed. Do not stop taking this medicine suddenly except upon the advice of your doctor. Stopping this medicine too quickly may cause serious side effects or your condition may worsen. A special MedGuide will be given to you by the pharmacist with each prescription and refill. Be sure to read this information carefully each time. Talk to your pediatrician regarding the use of this medicine in children. Special care may be needed. Overdosage: If you think you have taken too much of this medicine contact a poison control center or emergency room at once. NOTE: This medicine is only for you. Do not share this medicine with others. What if I miss a dose? If you miss a dose, take it as soon as you can. If it is almost time for your next dose, take only that dose. Do not take double or extra doses. What may interact with this medicine? Do not take this medicine with any of the following medications: -certain medicines  for fungal infections like fluconazole, itraconazole, ketoconazole, posaconazole, voriconazole -cisapride -dofetilide -dronedarone -linezolid -MAOIs like Carbex, Eldepryl, Marplan, Nardil, and Parnate -mesoridazine -methylene blue (injected into a vein) -pimozide -saquinavir -thioridazine This medicine may also interact with the following medications: -alcohol -antiviral medicines for HIV or AIDS -aspirin and aspirin-like medicines -barbiturates like phenobarbital -certain medicines for blood pressure, heart disease, irregular heart beat -certain medicines for depression, anxiety, or psychotic disturbances -certain medicines for migraine headache like almotriptan, eletriptan, frovatriptan, naratriptan, rizatriptan, sumatriptan, zolmitriptan -certain medicines for seizures like carbamazepine and phenytoin -certain medicines for sleep -certain medicines that treat or prevent blood clots like dalteparin, enoxaparin, warfarin -digoxin -fentanyl -lithium -NSAIDS, medicines for pain and inflammation, like ibuprofen or naproxen -other medicines that prolong the QT interval (cause an abnormal heart rhythm) -rasagiline -supplements like St. John's wort, kava kava, valerian -tramadol -tryptophan This list may not describe all possible interactions. Give your health care provider a list of all the medicines, herbs, non-prescription drugs, or dietary supplements you use. Also tell them if you smoke, drink alcohol, or use illegal drugs. Some items may interact with your medicine. What should I watch for while using this medicine? Tell your doctor if your symptoms do not get better or if they get worse. Visit your doctor or health care professional for regular checks on your progress. Because it may take several weeks to see the full effects of this medicine, it is important to continue your treatment as prescribed by your doctor. Patients and their families should watch out for new or worsening  thoughts of suicide or depression. Also watch   out for sudden changes in feelings such as feeling anxious, agitated, panicky, irritable, hostile, aggressive, impulsive, severely restless, overly excited and hyperactive, or not being able to sleep. If this happens, especially at the beginning of treatment or after a change in dose, call your health care professional. You may get drowsy or dizzy. Do not drive, use machinery, or do anything that needs mental alertness until you know how this medicine affects you. Do not stand or sit up quickly, especially if you are an older patient. This reduces the risk of dizzy or fainting spells. Alcohol may interfere with the effect of this medicine. Avoid alcoholic drinks. This medicine may cause dry eyes and blurred vision. If you wear contact lenses you may feel some discomfort. Lubricating drops may help. See your eye doctor if the problem does not go away or is severe. Your mouth may get dry. Chewing sugarless gum, sucking hard candy and drinking plenty of water may help. Contact your doctor if the problem does not go away or is severe. What side effects may I notice from receiving this medicine? Side effects that you should report to your doctor or health care professional as soon as possible: -allergic reactions like skin rash, itching or hives, swelling of the face, lips, or tongue -elevated mood, decreased need for sleep, racing thoughts, impulsive behavior -confusion -fast, irregular heartbeat -feeling faint or lightheaded, falls -feeling agitated, angry, or irritable -loss of balance or coordination -painful or prolonged erections -restlessness, pacing, inability to keep still -suicidal thoughts or other mood changes -tremors -trouble sleeping -seizures -unusual bleeding or bruising Side effects that usually do not require medical attention (report to your doctor or health care professional if they continue or are bothersome): -change in sex drive or  performance -change in appetite or weight -constipation -headache -muscle aches or pains -nausea This list may not describe all possible side effects. Call your doctor for medical advice about side effects. You may report side effects to FDA at 1-800-FDA-1088. Where should I keep my medicine? Keep out of the reach of children. Store at room temperature between 15 and 30 degrees C (59 to 86 degrees F). Protect from light. Keep container tightly closed. Throw away any unused medicine after the expiration date. NOTE: This sheet is a summary. It may not cover all possible information. If you have questions about this medicine, talk to your doctor, pharmacist, or health care provider.  2019 Elsevier/Gold Standard (2017-03-06 17:51:24)  

## 2018-05-23 NOTE — Progress Notes (Signed)
Virtual Visit via Video Note  I connected with Erin Frye on 05/23/18 at  1:20 PM EDT by a video enabled telemedicine application and verified that I am speaking with the correct person using two identifiers.  Location: Patient: home Provider: [facility]   I discussed the limitations of evaluation and management by telemedicine and the availability of in person appointments. The patient expressed understanding and agreed to proceed.   Margaretann Loveless, PA-C   Patient: Erin Frye Female    DOB: Aug 19, 1970   48 y.o.   MRN: [id number] Visit Date: 05/23/2018  Today's Provider: Margaretann Loveless, PA-C   No chief complaint on file.  Subjective:     Erin Frye is a 48 yr old female that presents via virtual visit with provider following up on Insomnia. Last month she had sent a mychart message that she was having difficulty sleeping with Covid-19 and loss of structured schedule. She started trazodone. She reports she is taking 1.5 tabs (75mg ) at bedtime and sleeping well with this at this time.   In the interim since she was last seen she did fall in her tub and strain her right hamstring. She was treated with anti-inflammatories and muscle relaxers. Was also given tramadol for pain. This was just over 2 weeks ago. She has completed therapy and states hamstring has been improving. Still has pain but very intermittently now.     Allergies  Allergen Reactions  . Codeine     itching     Current Outpatient Medications:  .  cyclobenzaprine (FLEXERIL) 10 MG tablet, Take 1 tablet (10 mg total) by mouth at bedtime as needed for muscle spasms. Do not drive while taking as can cause drowsiness, Disp: 15 tablet, Rfl: 0 .  ferrous sulfate 325 (65 FE) MG tablet, Take by mouth., Disp: , Rfl:  .  fexofenadine (ALLEGRA) 180 MG tablet, Take by mouth., Disp: , Rfl:  .  fluticasone (FLONASE ALLERGY RELIEF) 50 MCG/ACT nasal spray, Place into the nose., Disp: , Rfl:  .   losartan-hydrochlorothiazide (HYZAAR) 100-25 MG tablet, TAKE 1 TABLET DAILY, Disp: 90 tablet, Rfl: 1 .  meloxicam (MOBIC) 15 MG tablet, Take 1 tablet (15 mg total) by mouth daily as needed., Disp: 10 tablet, Rfl: 0 .  MULTIPLE VITAMIN PO, Take by mouth., Disp: , Rfl:  .  simvastatin (ZOCOR) 20 MG tablet, TAKE 1 TABLET BY MOUTH EVERY EVENING, Disp: 90 tablet, Rfl: 1 .  traMADol (ULTRAM) 50 MG tablet, Take 1 tablet (50 mg total) by mouth every 8 (eight) hours as needed., Disp: 12 tablet, Rfl: 0 .  traZODone (DESYREL) 50 MG tablet, Take 0.5-1 tablets (25-50 mg total) by mouth at bedtime as needed for sleep., Disp: 30 tablet, Rfl: 1  Review of Systems  Constitutional: Negative.   Respiratory: Negative.   Cardiovascular: Negative.   Musculoskeletal: Positive for myalgias (right hanstring; improving).  Neurological: Negative.   Psychiatric/Behavioral: Positive for sleep disturbance (improving). Negative for decreased concentration, dysphoric mood, self-injury and suicidal ideas. The patient is not nervous/anxious.     Social History   Tobacco Use  . Smoking status: Never Smoker  . Smokeless tobacco: Never Used  Substance Use Topics  . Alcohol use: Yes    Alcohol/week: 0.0 standard drinks    Comment: rare      Objective:   There were no vitals taken for this visit. There were no vitals filed for this visit.   Physical Exam Vitals signs reviewed.  Constitutional:      General: She is not in acute distress.    Appearance: Normal appearance. She is well-developed. She is obese. She is not ill-appearing.  HENT:     Head: Normocephalic and atraumatic.  Eyes:     General: No scleral icterus.    Extraocular Movements: Extraocular movements intact.  Neck:     Musculoskeletal: Normal range of motion and neck supple.  Pulmonary:     Effort: Pulmonary effort is normal. No respiratory distress.  Neurological:     Mental Status: She is alert.  Psychiatric:        Mood and Affect: Mood  normal.        Behavior: Behavior normal.        Thought Content: Thought content normal.        Judgment: Judgment normal.        Assessment & Plan    1. Difficulty sleeping Improving. Continue Trazodone 75mg  for now. Call if worsening. I will see her back in August for CPE.  - traZODone (DESYREL) 50 MG tablet; Take 1-2 tablets (50-100 mg total) by mouth at bedtime as needed for sleep.  Dispense: 180 tablet; Refill: 1  2. Strain of right hamstring, subsequent encounter Improving. Walking her dog again. No longer requiring medications.     I discussed the assessment and treatment plan with the patient. The patient was provided an opportunity to ask questions and all were answered. The patient agreed with the plan and demonstrated an understanding of the instructions.   The patient was advised to call back or seek an in-person evaluation if the symptoms worsen or if the condition fails to improve as anticipated.  I provided 17 minutes of non-face-to-face time during this encounter.   Margaretann LovelessJennifer M , PA-C  [facility]

## 2018-06-12 ENCOUNTER — Encounter: Payer: Self-pay | Admitting: Physician Assistant

## 2018-06-12 ENCOUNTER — Other Ambulatory Visit: Payer: BC Managed Care – PPO | Admitting: Orthotics

## 2018-06-19 ENCOUNTER — Telehealth (INDEPENDENT_AMBULATORY_CARE_PROVIDER_SITE_OTHER): Payer: BC Managed Care – PPO | Admitting: Physician Assistant

## 2018-06-19 ENCOUNTER — Encounter: Payer: Self-pay | Admitting: Physician Assistant

## 2018-06-19 DIAGNOSIS — L989 Disorder of the skin and subcutaneous tissue, unspecified: Secondary | ICD-10-CM

## 2018-06-19 DIAGNOSIS — J3489 Other specified disorders of nose and nasal sinuses: Secondary | ICD-10-CM

## 2018-06-19 MED ORDER — DOXYCYCLINE HYCLATE 100 MG PO TABS: 100.0000 mg | ORAL_TABLET | Freq: Two times a day (BID) | ORAL | 0 refills | Status: DC

## 2018-06-19 MED ORDER — MUPIROCIN 2 % EX OINT: 1.0000 "application " | TOPICAL_OINTMENT | Freq: Two times a day (BID) | CUTANEOUS | 0 refills | Status: DC

## 2018-06-19 NOTE — Progress Notes (Signed)
Virtual Visit via Video Note  I connected with Erin Frye on 06/19/18 at  3:40 PM EDT by a video enabled telemedicine application and verified that I am speaking with the correct person using two identifiers.  Location: Patient: home Provider: [facility]   I discussed the limitations of evaluation and management by telemedicine and the availability of in person appointments. The patient expressed understanding and agreed to proceed.   Patient: Erin Frye Female    DOB: 03/16/1970   48 y.o.   MRN: [id number] Visit Date: 06/19/2018  Today's Provider: Margaretann LovelessJennifer M Makaela Cando, PA-C   No chief complaint on file.  Subjective:    HPI  Erin FeilHeather Holley Frye is a 48 yr old female that presents via video visit through MyChart for new sores on her fingers and nose. She reports yesterday she had some sinus pressure and then this morning she awoke and had a sore in her nose. Her left nare was also swollen even into the upper lip on the left side. She then develop two sores on her right hand. She has been having these skin lesions more frequently over the last couple of years.    Allergies  Allergen Reactions  . Codeine     itching     Current Outpatient Medications:  .  ferrous sulfate 325 (65 FE) MG tablet, Take by mouth., Disp: , Rfl:  .  fexofenadine (ALLEGRA) 180 MG tablet, Take by mouth., Disp: , Rfl:  .  fluticasone (FLONASE ALLERGY RELIEF) 50 MCG/ACT nasal spray, Place into the nose., Disp: , Rfl:  .  losartan-hydrochlorothiazide (HYZAAR) 100-25 MG tablet, TAKE 1 TABLET DAILY, Disp: 90 tablet, Rfl: 1 .  MULTIPLE VITAMIN PO, Take by mouth., Disp: , Rfl:  .  simvastatin (ZOCOR) 20 MG tablet, TAKE 1 TABLET BY MOUTH EVERY EVENING, Disp: 90 tablet, Rfl: 1 .  traZODone (DESYREL) 50 MG tablet, Take 1-2 tablets (50-100 mg total) by mouth at bedtime as needed for sleep., Disp: 180 tablet, Rfl: 1  Review of Systems  Constitutional: Negative for appetite change, chills,  fatigue and fever.  HENT: Positive for sinus pressure. Negative for congestion, ear discharge, ear pain, nosebleeds, postnasal drip, rhinorrhea, sinus pain, sore throat and trouble swallowing.   Respiratory: Negative for chest tightness and shortness of breath.   Cardiovascular: Negative for chest pain and palpitations.  Gastrointestinal: Negative for abdominal pain, nausea and vomiting.  Skin: Positive for wound.  Neurological: Negative for dizziness and weakness.    Social History   Tobacco Use  . Smoking status: Never Smoker  . Smokeless tobacco: Never Used  Substance Use Topics  . Alcohol use: Yes    Alcohol/week: 0.0 standard drinks    Comment: rare      Objective:   There were no vitals taken for this visit. There were no vitals filed for this visit.   Physical Exam Vitals signs reviewed.  Constitutional:      General: She is not in acute distress.    Appearance: Normal appearance. She is well-developed. She is not ill-appearing.  HENT:     Head: Normocephalic and atraumatic.     Nose: Nasal tenderness and mucosal edema present.  Neck:     Musculoskeletal: Normal range of motion and neck supple.  Pulmonary:     Effort: Pulmonary effort is normal. No respiratory distress.  Neurological:     Mental Status: She is alert.  Psychiatric:        Mood and Affect: Mood  normal.        Behavior: Behavior normal.        Thought Content: Thought content normal.        Judgment: Judgment normal.        Assessment & Plan    1. Nasal sore Will use Bactroban ointment as below for the sore in the nose. Advised once we treat the current sores and have them healing it may be beneficial to have her come in the office around 1st-2nd week of July for a nasal swab to be tested to see if she is a carrier for MRSA. She agrees.  - mupirocin ointment (BACTROBAN) 2 %; Place 1 application into the nose 2 (two) times daily. For 5-7 days  Dispense: 22 g; Refill: 0  2. Skin sore Will  treat other open wounds with doxycycline as below.  - doxycycline (VIBRA-TABS) 100 MG tablet; Take 1 tablet (100 mg total) by mouth 2 (two) times daily.  Dispense: 20 tablet; Refill: 0    I discussed the assessment and treatment plan with the patient. The patient was provided an opportunity to ask questions and all were answered. The patient agreed with the plan and demonstrated an understanding of the instructions.   The patient was advised to call back or seek an in-person evaluation if the symptoms worsen or if the condition fails to improve as anticipated.  I provided 13 minutes of non-face-to-face time during this encounter.   Mar Daring, PA-C  [facility]

## 2018-07-24 ENCOUNTER — Encounter: Payer: Self-pay | Admitting: Physician Assistant

## 2018-07-24 ENCOUNTER — Ambulatory Visit: Payer: BC Managed Care – PPO | Admitting: Physician Assistant

## 2018-07-24 ENCOUNTER — Other Ambulatory Visit: Payer: Self-pay

## 2018-07-24 VITALS — BP 126/83 | HR 86 | Temp 98.7°F | Resp 16 | Wt 243.6 lb

## 2018-07-24 DIAGNOSIS — L02811 Cutaneous abscess of head [any part, except face]: Secondary | ICD-10-CM | POA: Diagnosis not present

## 2018-07-24 MED ORDER — DOXYCYCLINE HYCLATE 100 MG PO TABS: 100.0000 mg | ORAL_TABLET | Freq: Two times a day (BID) | ORAL | 0 refills | Status: DC

## 2018-07-24 NOTE — Progress Notes (Signed)
Patient: Erin Frye Female    DOB: 06/07/1970   48 y.o.   MRN: [id number] Visit Date: 07/24/2018  Today's Provider: Margaretann LovelessJennifer M , PA-C   Chief Complaint  Patient presents with  . Hair/Scalp Problem   Subjective:    I,[name],RMA am acting as a Neurosurgeonscribe for PPG IndustriesJennifer M. , PA-C.  HPI  Patient here with c/o infected spot on scalp. Notice it yesterday. Patient put some of the Bactroban 2% ointment. Recurrence of these lesions. Flare with stress.  Allergies  Allergen Reactions  . Codeine     itching     Current Outpatient Medications:  .  ferrous sulfate 325 (65 FE) MG tablet, Take by mouth., Disp: , Rfl:  .  fexofenadine (ALLEGRA) 180 MG tablet, Take by mouth., Disp: , Rfl:  .  fluticasone (FLONASE ALLERGY RELIEF) 50 MCG/ACT nasal spray, Place into the nose., Disp: , Rfl:  .  losartan-hydrochlorothiazide (HYZAAR) 100-25 MG tablet, TAKE 1 TABLET DAILY, Disp: 90 tablet, Rfl: 1 .  MULTIPLE VITAMIN PO, Take by mouth., Disp: , Rfl:  .  mupirocin ointment (BACTROBAN) 2 %, Place 1 application into the nose 2 (two) times daily. For 5-7 days, Disp: 22 g, Rfl: 0 .  simvastatin (ZOCOR) 20 MG tablet, TAKE 1 TABLET BY MOUTH EVERY EVENING, Disp: 90 tablet, Rfl: 1 .  traZODone (DESYREL) 50 MG tablet, Take 1-2 tablets (50-100 mg total) by mouth at bedtime as needed for sleep., Disp: 180 tablet, Rfl: 1  Review of Systems  Constitutional: Negative.   Respiratory: Negative.   Cardiovascular: Negative.   Skin: Positive for wound.  Neurological: Negative.     Social History   Tobacco Use  . Smoking status: Never Smoker  . Smokeless tobacco: Never Used  Substance Use Topics  . Alcohol use: Yes    Alcohol/week: 0.0 standard drinks    Comment: rare      Objective:   BP 126/83 (BP Location: Left Arm, Patient Position: Sitting, Cuff Size: Large)   Pulse 86   Temp 98.7 F (37.1 C) (Oral)   Resp 16   Wt 243 lb 9.6 oz (110.5 kg)   BMI 43.15 kg/m   Vitals:   07/24/18 1059  BP: 126/83  Pulse: 86  Resp: 16  Temp: 98.7 F (37.1 C)  TempSrc: Oral  Weight: 243 lb 9.6 oz (110.5 kg)     Physical Exam Vitals signs reviewed.  Constitutional:      General: She is not in acute distress.    Appearance: Normal appearance. She is well-developed. She is not ill-appearing or diaphoretic.  Neck:     Musculoskeletal: Normal range of motion and neck supple.     Thyroid: No thyromegaly.     Vascular: No JVD.     Trachea: No tracheal deviation.  Cardiovascular:     Rate and Rhythm: Normal rate and regular rhythm.     Heart sounds: Normal heart sounds. No murmur. No friction rub. No gallop.   Pulmonary:     Effort: Pulmonary effort is normal. No respiratory distress.     Breath sounds: Normal breath sounds. No wheezing or rales.  Skin:    Findings: Lesion present.       Neurological:     Mental Status: She is alert.      No results found for any visits on 07/24/18.     Assessment & Plan    1. Scalp abscess Normally responds well to doxycycline. Will treat  as below. Call if no improvement. - doxycycline (VIBRA-TABS) 100 MG tablet; Take 1 tablet (100 mg total) by mouth 2 (two) times daily.  Dispense: 20 tablet; Refill: 0     Mar Daring, PA-C  [facility]

## 2018-07-26 ENCOUNTER — Ambulatory Visit: Payer: Self-pay | Admitting: Physician Assistant

## 2018-07-28 ENCOUNTER — Other Ambulatory Visit: Payer: Self-pay | Admitting: Physician Assistant

## 2018-07-28 DIAGNOSIS — E782 Mixed hyperlipidemia: Secondary | ICD-10-CM

## 2018-07-28 DIAGNOSIS — I10 Essential (primary) hypertension: Secondary | ICD-10-CM

## 2018-09-23 ENCOUNTER — Telehealth (INDEPENDENT_AMBULATORY_CARE_PROVIDER_SITE_OTHER): Payer: BC Managed Care – PPO | Admitting: Physician Assistant

## 2018-09-23 ENCOUNTER — Other Ambulatory Visit: Payer: Self-pay

## 2018-09-23 ENCOUNTER — Encounter: Payer: Self-pay | Admitting: Physician Assistant

## 2018-09-23 DIAGNOSIS — Z20822 Contact with and (suspected) exposure to covid-19: Secondary | ICD-10-CM

## 2018-09-23 DIAGNOSIS — J0140 Acute pansinusitis, unspecified: Secondary | ICD-10-CM | POA: Diagnosis not present

## 2018-09-23 MED ORDER — AMOXICILLIN-POT CLAVULANATE 875-125 MG PO TABS: 1.0000 | ORAL_TABLET | Freq: Two times a day (BID) | ORAL | 0 refills | Status: DC

## 2018-09-23 NOTE — Patient Instructions (Signed)
Person Under Monitoring Name: Erin Frye  Location: [address] [place] [address]   Infection Prevention Recommendations for Individuals Confirmed to have, or Being Evaluated for, 2019 Novel Coronavirus (COVID-19) Infection Who Receive Care at Home  Individuals who are confirmed to have, or are being evaluated for, COVID-19 should follow the prevention steps below until a healthcare provider or local or state health department says they can return to normal activities.  Stay home except to get medical care You should restrict activities outside your home, except for getting medical care. Do not go to work, school, or public areas, and do not use public transportation or taxis.  Call ahead before visiting your doctor Before your medical appointment, call the healthcare provider and tell them that you have, or are being evaluated for, COVID-19 infection. This will help the healthcare provider's office take steps to keep other people from getting infected. Ask your healthcare provider to call the local or state health department.  Monitor your symptoms Seek prompt medical attention if your illness is worsening (e.g., difficulty breathing). Before going to your medical appointment, call the healthcare provider and tell them that you have, or are being evaluated for, COVID-19 infection. Ask your healthcare provider to call the local or state health department.  Wear a facemask You should wear a facemask that covers your nose and mouth when you are in the same room with other people and when you visit a healthcare provider. People who live with or visit you should also wear a facemask while they are in the same room with you.  Separate yourself from other people in your home As much as possible, you should stay in a different room from other people in your home. Also, you should use a separate bathroom, if available.  Avoid sharing household items You should  not share dishes, drinking glasses, cups, eating utensils, towels, bedding, or other items with other people in your home. After using these items, you should wash them thoroughly with soap and water.  Cover your coughs and sneezes Cover your mouth and nose with a tissue when you cough or sneeze, or you can cough or sneeze into your sleeve. Throw used tissues in a lined trash can, and immediately wash your hands with soap and water for at least 20 seconds or use an alcohol-based hand rub.  Wash your [employer] your hands often and thoroughly with soap and water for at least 20 seconds. You can use an alcohol-based hand sanitizer if soap and water are not available and if your hands are not visibly dirty. Avoid touching your eyes, nose, and mouth with unwashed hands.   Prevention Steps for Caregivers and Household Members of Individuals Confirmed to have, or Being Evaluated for, COVID-19 Infection Being Cared for in the Home  If you live with, or provide care at home for, a person confirmed to have, or being evaluated for, COVID-19 infection please follow these guidelines to prevent infection:  Follow healthcare provider's instructions Make sure that you understand and can help the patient follow any healthcare provider instructions for all care.  Provide for the patient's basic needs You should help the patient with basic needs in the home and provide support for getting groceries, prescriptions, and other personal needs.  Monitor the patient's symptoms If they are getting sicker, call his or her medical provider and tell them that the patient has, or is being evaluated for, COVID-19 infection. This will help the healthcare  provider's office take steps to keep other people from getting infected. Ask the healthcare provider to call the local or state health department.  Limit the number of people who have contact with the patient  If possible, have only one caregiver for the patient.   Other household members should stay in another home or place of residence. If this is not possible, they should stay  in another room, or be separated from the patient as much as possible. Use a separate bathroom, if available.  Restrict visitors who do not have an essential need to be in the home.  Keep older adults, very young children, and other sick people away from the patient Keep older adults, very young children, and those who have compromised immune systems or chronic health conditions away from the patient. This includes people with chronic heart, lung, or kidney conditions, diabetes, and cancer.  Ensure good ventilation Make sure that shared spaces in the home have good air flow, such as from an air conditioner or an opened window, weather permitting.  Wash your hands often  Wash your hands often and thoroughly with soap and water for at least 20 seconds. You can use an alcohol based hand sanitizer if soap and water are not available and if your hands are not visibly dirty.  Avoid touching your eyes, nose, and mouth with unwashed hands.  Use disposable paper towels to dry your hands. If not available, use dedicated cloth towels and replace them when they become wet.  Wear a facemask and gloves  Wear a disposable facemask at all times in the room and gloves when you touch or have contact with the patient's blood, body fluids, and/or secretions or excretions, such as sweat, saliva, sputum, nasal mucus, vomit, urine, or feces.  Ensure the mask fits over your nose and mouth tightly, and do not touch it during use.  Throw out disposable facemasks and gloves after using them. Do not reuse.  Wash your hands immediately after removing your facemask and gloves.  If your personal clothing becomes contaminated, carefully remove clothing and launder. Wash your hands after handling contaminated clothing.  Place all used disposable facemasks, gloves, and other waste in a lined  container before disposing them with other household waste.  Remove gloves and wash your hands immediately after handling these items.  Do not share dishes, glasses, or other household items with the patient  Avoid sharing household items. You should not share dishes, drinking glasses, cups, eating utensils, towels, bedding, or other items with a patient who is confirmed to have, or being evaluated for, COVID-19 infection.  After the person uses these items, you should wash them thoroughly with soap and water.  Wash laundry thoroughly  Immediately remove and wash clothes or bedding that have blood, body fluids, and/or secretions or excretions, such as sweat, saliva, sputum, nasal mucus, vomit, urine, or feces, on them.  Wear gloves when handling laundry from the patient.  Read and follow directions on labels of laundry or clothing items and detergent. In general, wash and dry with the warmest temperatures recommended on the label.  Clean all areas the individual has used often  Clean all touchable surfaces, such as counters, tabletops, doorknobs, bathroom fixtures, toilets, phones, keyboards, tablets, and bedside tables, every day. Also, clean any surfaces that may have blood, body fluids, and/or secretions or excretions on them.  Wear gloves when cleaning surfaces the patient has come in contact with.  Use a diluted bleach solution (e.g., dilute bleach with  1 part bleach and 10 parts water) or a household disinfectant with a label that says EPA-registered for coronaviruses. To make a bleach solution at home, add 1 tablespoon of bleach to 1 quart (4 cups) of water. For a larger supply, add  cup of bleach to 1 gallon (16 cups) of water.  Read labels of cleaning products and follow recommendations provided on product labels. Labels contain instructions for safe and effective use of the cleaning product including precautions you should take when applying the product, such as wearing gloves or  eye protection and making sure you have good ventilation during use of the product.  Remove gloves and wash hands immediately after cleaning.  Monitor yourself for signs and symptoms of illness Caregivers and household members are considered close contacts, should monitor their health, and will be asked to limit movement outside of the home to the extent possible. Follow the monitoring steps for close contacts listed on the symptom monitoring form.   ? If you have additional questions, contact your local health department or call the epidemiologist on call at [phone] (available 24/7). ? This guidance is subject to change. For the most up-to-date guidance from CDC, please refer to their website: http://www.martin.com/https://www.cdc.gov/coronavirus/2019-ncov/hcp/guidance-prevent-spread.htmlCOVID-19 COVID-19 is a respiratory infection that is caused by a virus called severe acute respiratory syndrome coronavirus 2 (SARS-CoV-2). The disease is also known as coronavirus disease or novel coronavirus. In some people, the virus may not cause any symptoms. In others, it may cause a serious infection. The infection can get worse quickly and can lead to complications, such as:  Pneumonia, or infection of the lungs.  Acute respiratory distress syndrome or ARDS. This is fluid build-up in the lungs.  Acute respiratory failure. This is a condition in which there is not enough oxygen passing from the lungs to the body.  Sepsis or septic shock. This is a serious bodily reaction to an infection.  Blood clotting problems.  Secondary infections due to bacteria or fungus. The virus that causes COVID-19 is contagious. This means that it can spread from person to person through droplets from coughs and sneezes (respiratory secretions). What are the causes? This illness is caused by a virus. You may catch the virus by:  Breathing in droplets from an infected person's cough or sneeze.  Touching something, like a table or a  doorknob, that was exposed to the virus (contaminated) and then touching your mouth, nose, or eyes. What increases the risk? Risk for infection You are more likely to be infected with this virus if you:  Live in or travel to an area with a COVID-19 outbreak.  Come in contact with a sick person who recently traveled to an area with a COVID-19 outbreak.  Provide care for or live with a person who is infected with COVID-19. Risk for serious illness You are more likely to become seriously ill from the virus if you:  Are 50 years of age or older.  Have a long-term disease that lowers your body's ability to fight infection (immunocompromised).  Live in a nursing home or long-term care facility.  Have a long-term (chronic) disease such as: ? Chronic lung disease, including chronic obstructive pulmonary disease or asthma ? Heart disease. ? Diabetes. ? Chronic kidney disease. ? Liver disease.  Are obese. What are the signs or symptoms? Symptoms of this condition can range from mild to severe. Symptoms may appear any time from 2 to 14 days after being exposed to the virus. They include:  A fever.  A cough.  Difficulty breathing.  Chills.  Muscle pains.  A sore throat.  Loss of taste or smell. Some people may also have stomach problems, such as nausea, vomiting, or diarrhea. Other people may not have any symptoms of COVID-19. How is this diagnosed? This condition may be diagnosed based on:  Your signs and symptoms, especially if: ? You live in an area with a COVID-19 outbreak. ? You recently traveled to or from an area where the virus is common. ? You provide care for or live with a person who was diagnosed with COVID-19.  A physical exam.  Lab tests, which may include: ? A nasal swab to take a sample of fluid from your nose. ? A throat swab to take a sample of fluid from your throat. ? A sample of mucus from your lungs (sputum). ? Blood tests.  Imaging tests, which  may include, X-rays, CT scan, or ultrasound. How is this treated? At present, there is no medicine to treat COVID-19. Medicines that treat other diseases are being used on a trial basis to see if they are effective against COVID-19. Your health care provider will talk with you about ways to treat your symptoms. For most people, the infection is mild and can be managed at home with rest, fluids, and over-the-counter medicines. Treatment for a serious infection usually takes places in a hospital intensive care unit (ICU). It may include one or more of the following treatments. These treatments are given until your symptoms improve.  Receiving fluids and medicines through an IV.  Supplemental oxygen. Extra oxygen is given through a tube in the nose, a face mask, or a hood.  Positioning you to lie on your stomach (prone position). This makes it easier for oxygen to get into the lungs.  Continuous positive airway pressure (CPAP) or bi-level positive airway pressure (BPAP) machine. This treatment uses mild air pressure to keep the airways open. A tube that is connected to a motor delivers oxygen to the body.  Ventilator. This treatment moves air into and out of the lungs by using a tube that is placed in your windpipe.  Tracheostomy. This is a procedure to create a hole in the neck so that a breathing tube can be inserted.  Extracorporeal membrane oxygenation (ECMO). This procedure gives the lungs a chance to recover by taking over the functions of the heart and lungs. It supplies oxygen to the body and removes carbon dioxide. Follow these instructions at home: Lifestyle  If you are sick, stay home except to get medical care. Your health care provider will tell you how long to stay home. Call your health care provider before you go for medical care.  Rest at home as told by your health care provider.  Do not use any products that contain nicotine or tobacco, such as cigarettes, e-cigarettes, and  chewing tobacco. If you need help quitting, ask your health care provider.  Return to your normal activities as told by your health care provider. Ask your health care provider what activities are safe for you. General instructions  Take over-the-counter and prescription medicines only as told by your health care provider.  Drink enough fluid to keep your urine pale yellow.  Keep all follow-up visits as told by your health care provider. This is important. How is this prevented?  There is no vaccine to help prevent COVID-19 infection. However, there are steps you can take to protect yourself and others from this virus. To protect yourself:  Do not travel to areas where COVID-19 is a risk. The areas where COVID-19 is reported change often. To identify high-risk areas and travel restrictions, check the CDC travel website: FatFares.com.br  If you live in, or must travel to, an area where COVID-19 is a risk, take precautions to avoid infection. ? Stay away from people who are sick. ? Wash your hands often with soap and water for 20 seconds. If soap and water are not available, use an alcohol-based hand sanitizer. ? Avoid touching your mouth, face, eyes, or nose. ? Avoid going out in public, follow guidance from your state and local health authorities. ? If you must go out in public, wear a cloth face covering or face mask. ? Disinfect objects and surfaces that are frequently touched every day. This may include:  Counters and tables.  Doorknobs and light switches.  Sinks and faucets.  Electronics, such as phones, remote controls, keyboards, computers, and tablets. To protect others: If you have symptoms of COVID-19, take steps to prevent the virus from spreading to others.  If you think you have a COVID-19 infection, contact your health care provider right away. Tell your health care team that you think you may have a COVID-19 infection.  Stay home. Leave your house only  to seek medical care. Do not use public transport.  Do not travel while you are sick.  Wash your hands often with soap and water for 20 seconds. If soap and water are not available, use alcohol-based hand sanitizer.  Stay away from other members of your household. Let healthy household members care for children and pets, if possible. If you have to care for children or pets, wash your hands often and wear a mask. If possible, stay in your own room, separate from others. Use a different bathroom.  Make sure that all people in your household wash their hands well and often.  Cough or sneeze into a tissue or your sleeve or elbow. Do not cough or sneeze into your hand or into the air.  Wear a cloth face covering or face mask. Where to find more information  Centers for Disease Control and Prevention: PurpleGadgets.be  World Health Organization: https://www.castaneda.info/ Contact a health care provider if:  You live in or have traveled to an area where COVID-19 is a risk and you have symptoms of the infection.  You have had contact with someone who has COVID-19 and you have symptoms of the infection. Get help right away if:  You have trouble breathing.  You have pain or pressure in your chest.  You have confusion.  You have bluish lips and fingernails.  You have difficulty waking from sleep.  You have symptoms that get worse. These symptoms may represent a serious problem that is an emergency. Do not wait to see if the symptoms will go away. Get medical help right away. Call your local emergency services (911 in the [place] Do not drive yourself to the hospital. Let the emergency medical personnel know if you think you have COVID-19. Summary  COVID-19 is a respiratory infection that is caused by a virus. It is also known as coronavirus disease or novel coronavirus. It can cause serious infections, such as pneumonia, acute respiratory distress  syndrome, acute respiratory failure, or sepsis.  The virus that causes COVID-19 is contagious. This means that it can spread from person to person through droplets from coughs and sneezes.  You are more likely to develop a serious illness if you are 65 years  of age or older, have a weak immunity, live in a nursing home, or have chronic disease.  There is no medicine to treat COVID-19. Your health care provider will talk with you about ways to treat your symptoms.  Take steps to protect yourself and others from infection. Wash your hands often and disinfect objects and surfaces that are frequently touched every day. Stay away from people who are sick and wear a mask if you are sick. This information is not intended to replace advice given to you by your health care provider. Make sure you discuss any questions you have with your health care provider. Document Released: 01/31/2018 Document Revised: 05/23/2018 Document Reviewed: 01/31/2018 Elsevier Patient Education  2020 [employer].

## 2018-09-23 NOTE — Progress Notes (Signed)
Patient: Erin Frye Female    DOB: 06/30/1970   48 y.o.   MRN: [id number] Visit Date: 09/23/2018  Today's Provider: Margaretann LovelessJennifer M Burnette, PA-C   No chief complaint on file.  Subjective:    I,[name],RMA am acting as a Neurosurgeonscribe for PPG IndustriesJennifer M. Burnette, PA-C. Virtual Visit via Video Note  I connected with Erin Frye on 09/23/18 at  1:40 PM EDT by a video enabled telemedicine application and verified that I am speaking with the correct person using two identifiers.  Location: Patient: Home Provider: [facility]   I discussed the limitations of evaluation and management by telemedicine and the availability of in person appointments. The patient expressed understanding and agreed to proceed.  Sinusitis This is a new problem. The current episode started yesterday (started with fatigue and malaise last week, then developed sore throat yesterday and today just has sinus pressure and pain with ears popping). The problem has been gradually worsening since onset. There has been no fever. The pain is mild. Associated symptoms include congestion, ear pain (fullness), headaches, sinus pressure and a sore throat. Pertinent negatives include no chills, coughing, diaphoresis, hoarse voice, neck pain, shortness of breath, sneezing or swollen glands. Treatments tried: antihistamines. The treatment provided no relief.      Allergies  Allergen Reactions  . Codeine     itching     Current Outpatient Medications:  .  doxycycline (VIBRA-TABS) 100 MG tablet, Take 1 tablet (100 mg total) by mouth 2 (two) times daily., Disp: 20 tablet, Rfl: 0 .  ferrous sulfate 325 (65 FE) MG tablet, Take by mouth., Disp: , Rfl:  .  fexofenadine (ALLEGRA) 180 MG tablet, Take by mouth., Disp: , Rfl:  .  fluticasone (FLONASE ALLERGY RELIEF) 50 MCG/ACT nasal spray, Place into the nose., Disp: , Rfl:  .  losartan-hydrochlorothiazide (HYZAAR) 100-25 MG tablet, TAKE 1 TABLET DAILY, Disp: 90  tablet, Rfl: 1 .  MULTIPLE VITAMIN PO, Take by mouth., Disp: , Rfl:  .  mupirocin ointment (BACTROBAN) 2 %, Place 1 application into the nose 2 (two) times daily. For 5-7 days, Disp: 22 g, Rfl: 0 .  simvastatin (ZOCOR) 20 MG tablet, TAKE 1 TABLET BY MOUTH EVERY EVENING, Disp: 90 tablet, Rfl: 1 .  traZODone (DESYREL) 50 MG tablet, Take 1-2 tablets (50-100 mg total) by mouth at bedtime as needed for sleep., Disp: 180 tablet, Rfl: 1  Review of Systems  Constitutional: Negative for chills, diaphoresis and fever.  HENT: Positive for congestion, ear pain (fullness), sinus pressure and sore throat. Negative for hoarse voice and sneezing.   Respiratory: Negative for cough and shortness of breath.   Cardiovascular: Negative.   Gastrointestinal: Negative for abdominal pain.  Musculoskeletal: Negative for neck pain.  Neurological: Positive for headaches. Negative for dizziness.    Social History   Tobacco Use  . Smoking status: Never Smoker  . Smokeless tobacco: Never Used  Substance Use Topics  . Alcohol use: Yes    Alcohol/week: 0.0 standard drinks    Comment: rare      Objective:   There were no vitals taken for this visit. There were no vitals filed for this visit.There is no height or weight on file to calculate BMI.   Physical Exam Vitals signs reviewed.  Constitutional:      General: She is not in acute distress. Pulmonary:     Effort: No respiratory distress.  Neurological:     Mental Status: She  is alert.      No results found for any visits on 09/23/18.     Assessment & Plan    1. Acute non-recurrent pansinusitis Sent for covid 19 testing just to be precautious with symptoms. No known exposure. Suspect most likely sinus infection.  Will give augmentin as below. Continue allergy medications. 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 Stay well hydrated and get plenty of rest. Call if no symptom improvement  or if symptoms worsen. - amoxicillin-clavulanate (AUGMENTIN) 875-125 MG tablet; Take 1 tablet by mouth 2 (two) times daily.  Dispense: 20 tablet; Refill: 0   I discussed the assessment and treatment plan with the patient. The patient was provided an opportunity to ask questions and all were answered. The patient agreed with the plan and demonstrated an understanding of the instructions.   The patient was advised to call back or seek an in-person evaluation if the symptoms worsen or if the condition fails to improve as anticipated.  I provided 14 minutes of non-face-to-face time during this encounter.    Mar Daring, PA-C  [facility]

## 2018-09-24 LAB — NOVEL CORONAVIRUS, NAA: SARS-CoV-2, NAA: NOT DETECTED

## 2018-11-05 ENCOUNTER — Other Ambulatory Visit: Payer: Self-pay | Admitting: Physician Assistant

## 2018-11-05 DIAGNOSIS — Z1231 Encounter for screening mammogram for malignant neoplasm of breast: Secondary | ICD-10-CM

## 2018-11-07 ENCOUNTER — Ambulatory Visit
Admission: RE | Admit: 2018-11-07 | Discharge: 2018-11-07 | Disposition: A | Discharge status: Home / Self Care | Payer: BC Managed Care – PPO | Source: Ambulatory Visit | Attending: Physician Assistant | Admitting: Physician Assistant

## 2018-11-07 DIAGNOSIS — Z1231 Encounter for screening mammogram for malignant neoplasm of breast: Secondary | ICD-10-CM | POA: Diagnosis present

## 2018-11-08 ENCOUNTER — Other Ambulatory Visit: Payer: Self-pay | Admitting: Physician Assistant

## 2018-11-08 DIAGNOSIS — N6489 Other specified disorders of breast: Secondary | ICD-10-CM

## 2018-11-08 DIAGNOSIS — R928 Other abnormal and inconclusive findings on diagnostic imaging of breast: Secondary | ICD-10-CM

## 2018-11-08 DIAGNOSIS — N6320 Unspecified lump in the left breast, unspecified quadrant: Secondary | ICD-10-CM

## 2018-11-12 ENCOUNTER — Encounter: Payer: Self-pay | Admitting: Physician Assistant

## 2018-11-18 ENCOUNTER — Ambulatory Visit
Admission: RE | Admit: 2018-11-18 | Discharge: 2018-11-18 | Disposition: A | Discharge status: Home / Self Care | Payer: BC Managed Care – PPO | Source: Ambulatory Visit | Attending: Physician Assistant | Admitting: Physician Assistant

## 2018-11-18 ENCOUNTER — Other Ambulatory Visit: Payer: Self-pay | Admitting: Physician Assistant

## 2018-11-18 DIAGNOSIS — R928 Other abnormal and inconclusive findings on diagnostic imaging of breast: Secondary | ICD-10-CM

## 2018-11-18 DIAGNOSIS — N6489 Other specified disorders of breast: Secondary | ICD-10-CM | POA: Diagnosis present

## 2018-11-18 DIAGNOSIS — N6320 Unspecified lump in the left breast, unspecified quadrant: Secondary | ICD-10-CM | POA: Insufficient documentation

## 2018-11-22 ENCOUNTER — Ambulatory Visit
Admission: RE | Admit: 2018-11-22 | Discharge: 2018-11-22 | Disposition: A | Discharge status: Home / Self Care | Payer: BC Managed Care – PPO | Source: Ambulatory Visit | Attending: Physician Assistant | Admitting: Physician Assistant

## 2018-11-22 DIAGNOSIS — R928 Other abnormal and inconclusive findings on diagnostic imaging of breast: Secondary | ICD-10-CM

## 2018-11-22 DIAGNOSIS — N6320 Unspecified lump in the left breast, unspecified quadrant: Secondary | ICD-10-CM

## 2018-11-22 HISTORY — PX: BREAST BIOPSY: SHX20

## 2018-11-25 LAB — SURGICAL PATHOLOGY

## 2018-11-28 ENCOUNTER — Encounter: Payer: Self-pay | Admitting: Physician Assistant

## 2018-12-03 NOTE — Progress Notes (Signed)
Patient: Erin Frye, Female    DOB: May 12, 1970, 48 y.o.   MRN: [id number] Visit Date: 12/04/2018  Today's Provider: Mar Daring, PA-C   Chief Complaint  Patient presents with  . Annual Exam  . Knee Pain   Subjective:     Annual physical exam Erin Frye is a 48 y.o. female who presents today for health maintenance and complete physical. She feels well. She reports exercising walking daily. She reports she is sleeping well. ----------------------------------------------------------------- Mammogram:11/07/18 09/03/2017 -Pap is normal, HPV negative. Will repeat in 3-5 years.  Patient C/O left knee pain x's several months. Patient reports that in the spring she took Meloxicam for muscle pain and reports that the medication helped with her knee pain.   Review of Systems  Constitutional: Negative.   HENT: Negative.   Eyes: Negative.   Respiratory: Negative.   Cardiovascular: Negative.   Gastrointestinal: Negative.   Endocrine: Negative.   Genitourinary: Negative.   Musculoskeletal: Positive for arthralgias (left knee pain).  Skin: Negative.   Allergic/Immunologic: Negative.   Neurological: Negative.   Hematological: Negative.   Psychiatric/Behavioral: Negative.     Social History      She  reports that she has never smoked. She has never used smokeless tobacco. She reports current alcohol use. She reports that she does not use drugs.       Social History   Socioeconomic History  . Marital status: Married    Spouse name: Not on file  . Number of children: Not on file  . Years of education: Not on file  . Highest education level: Not on file  Occupational History  . Not on file  Social Needs  . Financial resource strain: Not on file  . Food insecurity    Worry: Not on file    Inability: Not on file  . Transportation needs    Medical: Not on file    Non-medical: Not on file  Tobacco Use  . Smoking status: Never Smoker   . Smokeless tobacco: Never Used  Substance and Sexual Activity  . Alcohol use: Yes    Alcohol/week: 0.0 standard drinks    Comment: rare  . Drug use: No  . Sexual activity: Not on file  Lifestyle  . Physical activity    Days per week: Not on file    Minutes per session: Not on file  . Stress: Not on file  Relationships  . Social Herbalist on phone: Not on file    Gets together: Not on file    Attends religious service: Not on file    Active member of club or organization: Not on file    Attends meetings of clubs or organizations: Not on file    Relationship status: Not on file  Other Topics Concern  . Not on file  Social History Narrative  . Not on file    Past Medical History:  Diagnosis Date  . Hypertension      Patient Active Problem List   Diagnosis Date Noted  . Difficulty sleeping 04/29/2018  . Primary osteoarthritis of both knees 03/30/2015  . Hypercholesterolemia with hypertriglyceridemia 02/12/2015  . Perimenopausal 02/12/2015  . Prediabetes 02/12/2015  . Allergic rhinitis 08/24/2014  . Absolute anemia 08/24/2014  . Essential hypertension 08/20/2014    Past Surgical History:  Procedure Laterality Date  . BREAST BIOPSY Right 11/22/2018   Affirm bx-"X" clip-path pending  . BREAST BIOPSY Left 11/22/2018   [place]  bx venus clip path pending  . CESAREAN SECTION  2004  . GALLBLADDER SURGERY  2007    Family History        Family Status  Relation Name Status  . Mother  Deceased       02/12/16  . Father  Other  . Brother  Alive  . MGM  (Not Specified)        Her family history includes Breast cancer in her maternal grandmother and mother; Diabetes in her mother; Healthy in her brother.      Allergies  Allergen Reactions  . Codeine     itching     Current Outpatient Medications:  .  Biotin 10000 MCG TABS, Take by mouth., Disp: , Rfl:  .  ferrous sulfate 325 (65 FE) MG tablet, Take by mouth., Disp: , Rfl:  .  fexofenadine (ALLEGRA) 180  MG tablet, Take by mouth., Disp: , Rfl:  .  fluticasone (FLONASE ALLERGY RELIEF) 50 MCG/ACT nasal spray, Place into the nose., Disp: , Rfl:  .  losartan-hydrochlorothiazide (HYZAAR) 100-25 MG tablet, TAKE 1 TABLET DAILY, Disp: 90 tablet, Rfl: 1 .  MULTIPLE VITAMIN PO, Take by mouth., Disp: , Rfl:  .  simvastatin (ZOCOR) 20 MG tablet, TAKE 1 TABLET BY MOUTH EVERY EVENING, Disp: 90 tablet, Rfl: 1 .  traZODone (DESYREL) 50 MG tablet, Take 1-2 tablets (50-100 mg total) by mouth at bedtime as needed for sleep., Disp: 180 tablet, Rfl: 1   Patient Care Team: Mar Daring, PA-C as PCP - General (Family Medicine)    Objective:    Vitals: BP 136/76 (BP Location: Left Arm, Patient Position: Sitting, Cuff Size: Large)   Pulse 92   Temp (!) 97.3 F (36.3 C) (Temporal)   Resp 16   Ht 5' 3"  (1.6 m)   Wt 245 lb 4.8 oz (111.3 kg)   BMI 43.45 kg/m    Vitals:   12/04/18 1118  BP: 136/76  Pulse: 92  Resp: 16  Temp: (!) 97.3 F (36.3 C)  TempSrc: Temporal  Weight: 245 lb 4.8 oz (111.3 kg)  Height: 5' 3"  (1.6 m)     Physical Exam Vitals signs reviewed.  Constitutional:      General: She is not in acute distress.    Appearance: Normal appearance. She is well-developed. She is obese. She is not ill-appearing or diaphoretic.  HENT:     Head: Normocephalic and atraumatic.     Right Ear: Tympanic membrane, ear canal and external ear normal.     Left Ear: Tympanic membrane, ear canal and external ear normal.     Nose: Nose normal.  Eyes:     General: No scleral icterus.       Right eye: No discharge.        Left eye: No discharge.     Extraocular Movements: Extraocular movements intact.     Conjunctiva/sclera: Conjunctivae normal.     Pupils: Pupils are equal, round, and reactive to light.  Neck:     Musculoskeletal: Normal range of motion and neck supple.     Thyroid: No thyromegaly.     Vascular: No carotid bruit or JVD.     Trachea: No tracheal deviation.  Cardiovascular:      Rate and Rhythm: Normal rate and regular rhythm.     Pulses: Normal pulses.     Heart sounds: Normal heart sounds. No murmur. No friction rub. No gallop.   Pulmonary:     Effort: Pulmonary effort is normal. No  respiratory distress.     Breath sounds: Normal breath sounds. No wheezing or rales.  Chest:    Abdominal:     General: Abdomen is flat. Bowel sounds are normal. There is no distension.     Palpations: Abdomen is soft. There is no mass.     Tenderness: There is no abdominal tenderness. There is no guarding or rebound.  Musculoskeletal: Normal range of motion.        General: No tenderness.     Right lower leg: No edema.     Left lower leg: No edema.  Lymphadenopathy:     Cervical: No cervical adenopathy.  Skin:    General: Skin is warm and dry.     Capillary Refill: Capillary refill takes less than 2 seconds.     Findings: No rash.  Neurological:     General: No focal deficit present.     Mental Status: She is alert and oriented to person, place, and time. Mental status is at baseline.  Psychiatric:        Mood and Affect: Mood normal.        Behavior: Behavior normal.        Thought Content: Thought content normal.        Judgment: Judgment normal.      Depression Screen PHQ 2/9 Scores 12/04/2018 05/23/2018 05/02/2017 04/10/2016  PHQ - 2 Score 0 0 0 1  PHQ- 9 Score 0 - 3 4       Assessment & Plan:     Routine Health Maintenance and Physical Exam  Exercise Activities and Dietary recommendations Goals   None     Immunization History  Administered Date(s) Administered  . Influenza-Unspecified 10/19/2014, 10/15/2016, 10/19/2017    Health Maintenance  Topic Date Due  . HIV Screening  05/15/1983  . TETANUS/TDAP  05/15/1987  . COLONOSCOPY  05/15/2018  . INFLUENZA VACCINE  08/10/2018  . PAP SMEAR-Modifier  08/30/2020  . MAMMOGRAM  11/17/2020     Discussed health benefits of physical activity, and encouraged her to engage in regular exercise appropriate  for her age and condition.    1. Annual physical exam Normal physical exam today. Will check labs as below and f/u pending lab results. If labs are stable and WNL she will not need to have these rechecked for one year at her next annual physical exam. She is to call the office in the meantime if she has any acute issue, questions or concerns. - CBC with Differential/Platelet - Comprehensive metabolic panel - TSH  2. Colon cancer screening Referral to GI placed. No previous colonoscopy. Son does have UC.   3. Family history of breast cancer in female In mother and maternal grandmother. Discussed BRCA testing but she wants to discuss with her husband before testing.   4. History of abnormal mammogram With most recent mammogram. Left breast pathology revealed benign breast tissue with fibrocystic changes and papillary apocrine metaplasia. Right breast biopsy was benign breast tissue with dense stromal fibrosis. Will have repeat diagnostic mammogram and [place] in 6 months.     5. Prediabetes Diet controlled. Will check labs as below and f/u pending results. - CBC with Differential/Platelet - Comprehensive metabolic panel - Lipid Panel With LDL/HDL Ratio - HgB A1c  6. Hypercholesterolemia with hypertriglyceridemia Stable on Simvastatin 72m. Will check labs as below and f/u pending results. - CBC with Differential/Platelet - Comprehensive metabolic panel - Lipid Panel With LDL/HDL Ratio - HgB A1c  7. Essential hypertension Stable. Continue Losartan-HCTZ 100-275m  Will check labs as below and f/u pending results. - CBC with Differential/Platelet - Lipid Panel With LDL/HDL Ratio - HgB A1c  8. Primary osteoarthritis of both knees Worsening. Has underwent Synvisc injections bilaterally. Right knee has improved, left knee is still symptomatic. Will give meloxicam as below.  - meloxicam (MOBIC) 15 MG tablet; Take 1 tablet (15 mg total) by mouth daily.  Dispense: 90 tablet; Refill: 1  9.  Class 3 severe obesity due to excess calories with serious comorbidity and body mass index (BMI) of 40.0 to 44.9 in adult [facility]) Counseled patient on healthy lifestyle modifications including dieting and exercise.   --------------------------------------------------------------------    Mar Daring, PA-C  [facility]

## 2018-12-04 ENCOUNTER — Encounter: Payer: Self-pay | Admitting: Physician Assistant

## 2018-12-04 ENCOUNTER — Ambulatory Visit (INDEPENDENT_AMBULATORY_CARE_PROVIDER_SITE_OTHER): Payer: BC Managed Care – PPO | Admitting: Physician Assistant

## 2018-12-04 ENCOUNTER — Other Ambulatory Visit: Payer: Self-pay

## 2018-12-04 VITALS — BP 136/76 | HR 92 | Temp 97.3°F | Resp 16 | Ht 63.0 in | Wt 245.3 lb

## 2018-12-04 DIAGNOSIS — E782 Mixed hyperlipidemia: Secondary | ICD-10-CM

## 2018-12-04 DIAGNOSIS — Z0000 Encounter for general adult medical examination without abnormal findings: Secondary | ICD-10-CM | POA: Diagnosis not present

## 2018-12-04 DIAGNOSIS — Z1211 Encounter for screening for malignant neoplasm of colon: Secondary | ICD-10-CM

## 2018-12-04 DIAGNOSIS — Z803 Family history of malignant neoplasm of breast: Secondary | ICD-10-CM | POA: Diagnosis not present

## 2018-12-04 DIAGNOSIS — I10 Essential (primary) hypertension: Secondary | ICD-10-CM

## 2018-12-04 DIAGNOSIS — R7303 Prediabetes: Secondary | ICD-10-CM

## 2018-12-04 DIAGNOSIS — Z87898 Personal history of other specified conditions: Secondary | ICD-10-CM | POA: Diagnosis not present

## 2018-12-04 DIAGNOSIS — M170 Bilateral primary osteoarthritis of knee: Secondary | ICD-10-CM

## 2018-12-04 DIAGNOSIS — Z6841 Body Mass Index (BMI) 40.0 and over, adult: Secondary | ICD-10-CM

## 2018-12-04 MED ORDER — MELOXICAM 15 MG PO TABS: 15.0000 mg | ORAL_TABLET | Freq: Every day | ORAL | 1 refills | Status: DC

## 2018-12-04 NOTE — Patient Instructions (Signed)

## 2018-12-05 LAB — CBC WITH DIFFERENTIAL/PLATELET
Basophils Absolute: 0.1 10*3/uL (ref 0.0–0.2)
Basos: 1 %
EOS (ABSOLUTE): 0.3 10*3/uL (ref 0.0–0.4)
Eos: 3 %
Hematocrit: 39.5 % (ref 34.0–46.6)
Hemoglobin: 13.1 g/dL (ref 11.1–15.9)
Immature Grans (Abs): 0 10*3/uL (ref 0.0–0.1)
Immature Granulocytes: 0 %
Lymphocytes Absolute: 2.8 10*3/uL (ref 0.7–3.1)
Lymphs: 29 %
MCH: 30.4 pg (ref 26.6–33.0)
MCHC: 33.2 g/dL (ref 31.5–35.7)
MCV: 92 fL (ref 79–97)
Monocytes Absolute: 0.6 10*3/uL (ref 0.1–0.9)
Monocytes: 6 %
Neutrophils Absolute: 5.8 10*3/uL (ref 1.4–7.0)
Neutrophils: 61 %
Platelets: 320 10*3/uL (ref 150–450)
RBC: 4.31 x10E6/uL (ref 3.77–5.28)
RDW: 12.4 % (ref 11.7–15.4)
WBC: 9.5 10*3/uL (ref 3.4–10.8)

## 2018-12-05 LAB — COMPREHENSIVE METABOLIC PANEL
ALT: 18 IU/L (ref 0–32)
AST: 16 IU/L (ref 0–40)
Albumin/Globulin Ratio: 1.6 (ref 1.2–2.2)
Albumin: 4.7 g/dL (ref 3.8–4.8)
Alkaline Phosphatase: 104 IU/L (ref 39–117)
BUN/Creatinine Ratio: 27 — ABNORMAL HIGH (ref 9–23)
BUN: 17 mg/dL (ref 6–24)
Bilirubin Total: 0.3 mg/dL (ref 0.0–1.2)
CO2: 25 mmol/L (ref 20–29)
Calcium: 9.9 mg/dL (ref 8.7–10.2)
Chloride: 99 mmol/L (ref 96–106)
Creatinine, Ser: 0.63 mg/dL (ref 0.57–1.00)
GFR calc Af Amer: 121 mL/min/{1.73_m2} (ref >59)
GFR calc non Af Amer: 105 mL/min/{1.73_m2} (ref >59)
Globulin, Total: 2.9 g/dL (ref 1.5–4.5)
Glucose: 89 mg/dL (ref 65–99)
Potassium: 4 mmol/L (ref 3.5–5.2)
Sodium: 141 mmol/L (ref 134–144)
Total Protein: 7.6 g/dL (ref 6.0–8.5)

## 2018-12-05 LAB — LIPID PANEL WITH LDL/HDL RATIO
Cholesterol, Total: 218 mg/dL — ABNORMAL HIGH (ref 100–199)
HDL: 56 mg/dL (ref >39)
LDL Chol Calc (NIH): 110 mg/dL — ABNORMAL HIGH (ref 0–99)
LDL/HDL Ratio: 2 ratio (ref 0.0–3.2)
Triglycerides: 306 mg/dL — ABNORMAL HIGH (ref 0–149)
VLDL Cholesterol Cal: 52 mg/dL — ABNORMAL HIGH (ref 5–40)

## 2018-12-05 LAB — HEMOGLOBIN A1C
Est. average glucose Bld gHb Est-mCnc: 126 mg/dL
Hgb A1c MFr Bld: 6 % — ABNORMAL HIGH (ref 4.8–5.6)

## 2018-12-05 LAB — TSH: TSH: 1.29 u[IU]/mL (ref 0.450–4.500)

## 2018-12-16 ENCOUNTER — Encounter: Payer: Self-pay | Admitting: Physician Assistant

## 2018-12-16 DIAGNOSIS — R3989 Other symptoms and signs involving the genitourinary system: Secondary | ICD-10-CM

## 2018-12-16 MED ORDER — CEPHALEXIN 500 MG PO CAPS: 500.0000 mg | ORAL_CAPSULE | Freq: Two times a day (BID) | ORAL | 0 refills | Status: DC

## 2018-12-24 ENCOUNTER — Encounter: Payer: Self-pay | Admitting: *Deleted

## 2018-12-25 ENCOUNTER — Other Ambulatory Visit: Payer: Self-pay | Admitting: Physician Assistant

## 2018-12-25 DIAGNOSIS — J3489 Other specified disorders of nose and nasal sinuses: Secondary | ICD-10-CM

## 2018-12-26 NOTE — Telephone Encounter (Signed)
Requested medication (s) are due for refill today: yes  Requested medication (s) are on the active medication list: no  Last refill:  06/19/2018  Future visit scheduled: no}  Notes to clinic:  no assigned protocol   Requested Prescriptions  Pending Prescriptions Disp Refills   mupirocin ointment (BACTROBAN) 2 % [Pharmacy Med Name: MUPIROCIN 2% OINTMENT 22GM] 22 g 0    Sig: APPLY 1 APPLICATION INTO THE NOSE TWICE DAILY FOR 5 TO 7 DAYS      Off-Protocol Failed - 12/25/2018  9:35 PM      Failed - Medication not assigned to a protocol, review manually.      Passed - Valid encounter within last 12 months    Recent Outpatient Visits           3 weeks ago Annual physical exam   [place], [place]   3 months ago Acute non-recurrent pansinusitis   [place], [place]   5 months ago Scalp abscess   [place], [place]   6 months ago Nasal sore   [place], [place]   7 months ago Difficulty sleeping   [facility] [place], [place]

## 2019-01-15 ENCOUNTER — Other Ambulatory Visit: Payer: Self-pay | Admitting: Physician Assistant

## 2019-01-15 DIAGNOSIS — I10 Essential (primary) hypertension: Secondary | ICD-10-CM

## 2019-01-16 ENCOUNTER — Other Ambulatory Visit: Payer: Self-pay | Admitting: Physician Assistant

## 2019-01-16 DIAGNOSIS — G479 Sleep disorder, unspecified: Secondary | ICD-10-CM

## 2019-02-01 ENCOUNTER — Other Ambulatory Visit: Payer: Self-pay | Admitting: Physician Assistant

## 2019-02-01 DIAGNOSIS — E782 Mixed hyperlipidemia: Secondary | ICD-10-CM

## 2019-03-06 ENCOUNTER — Ambulatory Visit: Payer: BC Managed Care – PPO

## 2019-03-08 ENCOUNTER — Ambulatory Visit: Payer: BC Managed Care – PPO

## 2019-03-08 ENCOUNTER — Ambulatory Visit: Payer: BC Managed Care – PPO | Attending: Internal Medicine

## 2019-03-08 DIAGNOSIS — Z23 Encounter for immunization: Secondary | ICD-10-CM | POA: Insufficient documentation

## 2019-03-08 NOTE — Progress Notes (Signed)
   Covid-19 Vaccination Clinic  Name:  Erin Frye    MRN: [id number] DOB: 1970-07-14  03/08/2019  [name] was observed post Covid-19 immunization for 15 minutes without incidence. She was provided with Vaccine Information Sheet and instruction to access the V-Safe system.   [name] was instructed to call 911 with any severe reactions post vaccine: Marland Kitchen Difficulty breathing  . Swelling of your face and throat  . A fast heartbeat  . A bad rash all over your body  . Dizziness and weakness    Immunizations Administered    Name Date Dose VIS Date Route   Moderna COVID-19 Vaccine 03/08/2019  3:49 PM 0.5 mL 12/10/2018 Intramuscular   Manufacturer: Moderna   Lot: [id number]   NDC: 69223-009-79

## 2019-04-05 ENCOUNTER — Ambulatory Visit: Payer: BC Managed Care – PPO | Attending: Internal Medicine

## 2019-04-05 DIAGNOSIS — Z23 Encounter for immunization: Secondary | ICD-10-CM

## 2019-04-05 NOTE — Progress Notes (Signed)
   Covid-19 Vaccination Clinic  Name:  Erin Frye    MRN: [id number] DOB: October 20, 1970  04/05/2019  [name] was observed post Covid-19 immunization for 15 minutes without incident. She was provided with Vaccine Information Sheet and instruction to access the V-Safe system.   [name] was instructed to call 911 with any severe reactions post vaccine: Marland Kitchen Difficulty breathing  . Swelling of face and throat  . A fast heartbeat  . A bad rash all over body  . Dizziness and weakness   Immunizations Administered    Name Date Dose VIS Date Route   Moderna COVID-19 Vaccine 04/05/2019  1:16 PM 0.5 mL 12/10/2018 Intramuscular   Manufacturer: Gala Murdoch   Lot: [id number]   NDC: 27639-432-00

## 2019-05-07 ENCOUNTER — Encounter: Payer: Self-pay | Admitting: Physician Assistant

## 2019-05-07 ENCOUNTER — Other Ambulatory Visit: Payer: Self-pay | Admitting: Physician Assistant

## 2019-05-07 DIAGNOSIS — N6320 Unspecified lump in the left breast, unspecified quadrant: Secondary | ICD-10-CM

## 2019-05-07 DIAGNOSIS — R928 Other abnormal and inconclusive findings on diagnostic imaging of breast: Secondary | ICD-10-CM

## 2019-07-01 ENCOUNTER — Ambulatory Visit
Admission: RE | Admit: 2019-07-01 | Discharge: 2019-07-01 | Disposition: A | Discharge status: Home / Self Care | Payer: BC Managed Care – PPO | Source: Ambulatory Visit | Attending: Physician Assistant | Admitting: Physician Assistant

## 2019-07-01 DIAGNOSIS — R928 Other abnormal and inconclusive findings on diagnostic imaging of breast: Secondary | ICD-10-CM

## 2019-07-01 DIAGNOSIS — N6320 Unspecified lump in the left breast, unspecified quadrant: Secondary | ICD-10-CM

## 2019-07-27 ENCOUNTER — Other Ambulatory Visit: Payer: Self-pay | Admitting: Physician Assistant

## 2019-07-27 DIAGNOSIS — E782 Mixed hyperlipidemia: Secondary | ICD-10-CM

## 2019-07-27 DIAGNOSIS — I10 Essential (primary) hypertension: Secondary | ICD-10-CM

## 2019-07-27 NOTE — Telephone Encounter (Signed)
Requested Prescriptions  Pending Prescriptions Disp Refills  . simvastatin (ZOCOR) 20 MG tablet [Pharmacy Med Name: SIMVASTATIN 20MG  TABLETS] 90 tablet 0    Sig: TAKE 1 TABLET BY MOUTH EVERY EVENING     Cardiovascular:  Antilipid - Statins Failed - 07/27/2019  9:00 AM      Failed - Total Cholesterol in normal range and within 360 days    Cholesterol, Total  Date Value Ref Range Status  12/04/2018 218 (H) 100 - 199 mg/dL Final         Failed - LDL in normal range and within 360 days    LDL Chol Calc (NIH)  Date Value Ref Range Status  12/04/2018 110 (H) 0 - 99 mg/dL Final         Failed - Triglycerides in normal range and within 360 days    Triglycerides  Date Value Ref Range Status  12/04/2018 306 (H) 0 - 149 mg/dL Final         Passed - HDL in normal range and within 360 days    HDL  Date Value Ref Range Status  12/04/2018 56 >39 mg/dL Final         Passed - Patient is not pregnant      Passed - Valid encounter within last 12 months    Recent Outpatient Visits          7 months ago Annual physical exam   [facility] M, PA-C   10 months ago Acute non-recurrent pansinusitis   [facility] [place], [place], [place]   1 year ago Scalp abscess   [facility] [place], [place], [place]   1 year ago Nasal sore   [facility] M, M   1 year ago Difficulty sleeping   [facility] [place], Jennifer M, M             . losartan-hydrochlorothiazide (HYZAAR) 100-25 MG tablet [Pharmacy Med Name: LOSARTAN/HCTZ 100/25MG  TABLETS] 30 tablet 0    Sig: TAKE 1 TABLET BY MOUTH EVERY DAY     Cardiovascular: ARB + Diuretic Combos Failed - 07/27/2019  9:00 AM      Failed - K in normal range and within 180 days    Potassium  Date Value Ref Range Status  12/04/2018 4.0 3.5 - 5.2 mmol/L Final         Failed - Na in normal range and within 180 days    Sodium  Date  Value Ref Range Status  12/04/2018 141 134 - 144 mmol/L Final         Failed - Cr in normal range and within 180 days    Creatinine, Ser  Date Value Ref Range Status  12/04/2018 0.63 0.57 - 1.00 mg/dL Final         Failed - Ca in normal range and within 180 days    Calcium  Date Value Ref Range Status  12/04/2018 9.9 8.7 - 10.2 mg/dL Final         Failed - Valid encounter within last 6 months    Recent Outpatient Visits          7 months ago Annual physical exam   [facility] M, M   10 months ago Acute non-recurrent pansinusitis   [facility] [place], [place], [place]   1 year ago Scalp abscess   [facility] M, M   1 year ago Nasal sore  [facility] [place], [place], [place]   1 year ago Difficulty sleeping   [facility] [place], [place]             Passed - Patient is not pregnant      Passed - Last BP in normal range    BP Readings from Last 1 Encounters:  12/04/18 136/76

## 2019-08-30 ENCOUNTER — Other Ambulatory Visit: Payer: Self-pay | Admitting: Physician Assistant

## 2019-08-30 DIAGNOSIS — I10 Essential (primary) hypertension: Secondary | ICD-10-CM

## 2019-08-30 NOTE — Telephone Encounter (Signed)
Requested medications are due for refill today? Yes  Requested medications are on active medication list?  Yes  Last Refill:  07/27/2019 # 30 with no refills    Courtesy refill with reminder for patient to schedule an appointment for further refills.    Future visit scheduled? No   Notes to Clinic:  Medication failed RX refill protocol due to no valid encounter in the past 6 months a no labs in the past 180 days.  The last labs were performed on 12/04/2018.

## 2019-09-02 ENCOUNTER — Encounter: Payer: Self-pay | Admitting: Physician Assistant

## 2019-09-02 DIAGNOSIS — I10 Essential (primary) hypertension: Secondary | ICD-10-CM

## 2019-09-03 MED ORDER — LOSARTAN POTASSIUM-HCTZ 100-25 MG PO TABS: 1.0000 | ORAL_TABLET | Freq: Every day | ORAL | 1 refills | Status: DC

## 2019-10-07 ENCOUNTER — Encounter: Payer: Self-pay | Admitting: Physician Assistant

## 2019-10-09 NOTE — Telephone Encounter (Signed)
Patient contacted the office unclear if an appointment is needed with PCP or if orders will be placed with [facility]. Please advise patient thru MyChart

## 2019-10-13 ENCOUNTER — Ambulatory Visit (INDEPENDENT_AMBULATORY_CARE_PROVIDER_SITE_OTHER): Payer: BC Managed Care – PPO | Admitting: Physician Assistant

## 2019-10-13 ENCOUNTER — Other Ambulatory Visit: Payer: Self-pay

## 2019-10-13 ENCOUNTER — Encounter: Payer: Self-pay | Admitting: Physician Assistant

## 2019-10-13 VITALS — BP 133/78 | HR 94 | Temp 98.7°F | Resp 16 | Wt 247.0 lb

## 2019-10-13 DIAGNOSIS — N644 Mastodynia: Secondary | ICD-10-CM

## 2019-10-13 NOTE — Patient Instructions (Signed)

## 2019-10-13 NOTE — Progress Notes (Signed)
Established patient visit   Patient: Erin Frye   DOB: [birth year]   49 y.o. Female  MRN: [id number] Visit Date: 10/13/2019  Today's healthcare provider: Margaretann Loveless, PA-C   Chief Complaint  Patient presents with  . Breast Pain   Subjective    HPI  Patient here today with c/o left breast pain. She noticed it 3 weeks ago. She reports is just a deep ache. No lump or mass felt. She has a history of abnormal mammograms in the past of the same breast requiring biopsy of a small mass directly below the nipple.   Patient Active Problem List   Diagnosis Date Noted  . Difficulty sleeping 04/29/2018  . Primary osteoarthritis of both knees 03/30/2015  . Hypercholesterolemia with hypertriglyceridemia 02/12/2015  . Perimenopausal 02/12/2015  . Prediabetes 02/12/2015  . Allergic rhinitis 08/24/2014  . Absolute anemia 08/24/2014  . Essential hypertension 08/20/2014   Past Medical History:  Diagnosis Date  . Hypertension        Medications: Outpatient Medications Prior to Visit  Medication Sig  . Biotin 97353 MCG TABS Take by mouth.  . cephALEXin (KEFLEX) 500 MG capsule Take 1 capsule (500 mg total) by mouth 2 (two) times daily.  . ferrous sulfate 325 (65 FE) MG tablet Take by mouth.  . fexofenadine (ALLEGRA) 180 MG tablet Take by mouth.  . fluticasone (FLONASE ALLERGY RELIEF) 50 MCG/ACT nasal spray Place into the nose.  . losartan-hydrochlorothiazide (HYZAAR) 100-25 MG tablet Take 1 tablet by mouth daily.  . meloxicam (MOBIC) 15 MG tablet Take 1 tablet (15 mg total) by mouth daily.  . MULTIPLE VITAMIN PO Take by mouth.  . mupirocin ointment (BACTROBAN) 2 % APPLY 1 APPLICATION INTO THE NOSE TWICE DAILY FOR 5 TO 7 DAYS  . simvastatin (ZOCOR) 20 MG tablet TAKE 1 TABLET BY MOUTH EVERY EVENING  . traZODone (DESYREL) 50 MG tablet TAKE 1 TO 2 TABLETS BY MOUTH AT BEDTIME AS NEEDED FOR SLEEP   No facility-administered medications prior to visit.    Review of  Systems  Constitutional: Negative.   Respiratory: Negative.   Cardiovascular: Negative.   Gastrointestinal: Negative.   Neurological: Negative.     Last CBC Lab Results  Component Value Date   WBC 9.5 12/04/2018   HGB 13.1 12/04/2018   HCT 39.5 12/04/2018   MCV 92 12/04/2018   MCH 30.4 12/04/2018   RDW 12.4 12/04/2018   PLT 320 12/04/2018   Last metabolic panel Lab Results  Component Value Date   GLUCOSE 89 12/04/2018   NA 141 12/04/2018   K 4.0 12/04/2018   CL 99 12/04/2018   CO2 25 12/04/2018   BUN 17 12/04/2018   CREATININE 0.63 12/04/2018   GFRNONAA 105 12/04/2018   GFRAA 121 12/04/2018   CALCIUM 9.9 12/04/2018   PROT 7.6 12/04/2018   ALBUMIN 4.7 12/04/2018   LABGLOB 2.9 12/04/2018   AGRATIO 1.6 12/04/2018   BILITOT 0.3 12/04/2018   ALKPHOS 104 12/04/2018   AST 16 12/04/2018   ALT 18 12/04/2018      Objective    BP 133/78 (BP Location: Left Arm, Patient Position: Sitting, Cuff Size: Large)   Pulse 94   Temp 98.7 F (37.1 C) (Oral)   Resp 16   Wt 247 lb (112 kg)   LMP 04/02/2016 (Exact Date)   BMI 43.75 kg/m  BP Readings from Last 3 Encounters:  10/13/19 133/78  12/04/18 136/76  07/24/18 126/83   Wt Readings from Last 3  Encounters:  10/13/19 247 lb (112 kg)  12/04/18 245 lb 4.8 oz (111.3 kg)  07/24/18 243 lb 9.6 oz (110.5 kg)      Physical Exam Vitals reviewed.  Constitutional:      General: She is not in acute distress.    Appearance: Normal appearance. She is well-developed. She is obese. She is not ill-appearing.  HENT:     Head: Normocephalic and atraumatic.  Pulmonary:     Effort: Pulmonary effort is normal. No respiratory distress.  Chest:     Breasts:        Right: Normal.        Left: Tenderness present. No swelling, mass, nipple discharge or skin change.    Musculoskeletal:     Cervical back: Normal range of motion and neck supple.  Lymphadenopathy:     Upper Body:     Right upper body: No supraclavicular, axillary or  pectoral adenopathy.     Left upper body: No supraclavicular, axillary or pectoral adenopathy.  Neurological:     Mental Status: She is alert.  Psychiatric:        Behavior: Behavior normal.        Thought Content: Thought content normal.        Judgment: Judgment normal.       No results found for any visits on 10/13/19.  Assessment & Plan     1. Breast pain, left Left breast pain, no mass. Same breast with previous biopsy. Diagnostic and [place] ordered for further evaluation.  - MM DIAG BREAST TOMO BILATERAL; Future - US BREAST LTD UNI LEFT INC AXILLA; Future   No follow-ups on file.      Delmer Islam, PA-C, have reviewed all documentation for this visit. The documentation on 10/16/19 for the exam, diagnosis, procedures, and orders are all accurate and complete.   Reine Just  [facility] [phone] (phone) [phone] (fax)  [facility]

## 2019-10-26 ENCOUNTER — Other Ambulatory Visit: Payer: Self-pay | Admitting: Physician Assistant

## 2019-10-26 DIAGNOSIS — E782 Mixed hyperlipidemia: Secondary | ICD-10-CM

## 2019-10-26 NOTE — Telephone Encounter (Signed)
Requested Prescriptions  Pending Prescriptions Disp Refills  . simvastatin (ZOCOR) 20 MG tablet [Pharmacy Med Name: SIMVASTATIN 20MG  TABLETS] 90 tablet 0    Sig: TAKE 1 TABLET BY MOUTH EVERY EVENING     Cardiovascular:  Antilipid - Statins Failed - 10/26/2019  8:50 AM      Failed - Total Cholesterol in normal range and within 360 days    Cholesterol, Total  Date Value Ref Range Status  12/04/2018 218 (H) 100 - 199 mg/dL Final         Failed - LDL in normal range and within 360 days    LDL Chol Calc (NIH)  Date Value Ref Range Status  12/04/2018 110 (H) 0 - 99 mg/dL Final         Failed - Triglycerides in normal range and within 360 days    Triglycerides  Date Value Ref Range Status  12/04/2018 306 (H) 0 - 149 mg/dL Final         Passed - HDL in normal range and within 360 days    HDL  Date Value Ref Range Status  12/04/2018 56 >39 mg/dL Final         Passed - Patient is not pregnant      Passed - Valid encounter within last 12 months    Recent Outpatient Visits          1 week ago Breast pain, left   [facility] [place], [place], [place]   10 months ago Annual physical exam   [facility] [place], [place], [place]andra Bevels   1 year ago Acute non-recurrent pansinusitis   [facility] [place], [place], [place] Bevels   1 year ago Scalp abscess   [facility] [place], [place], [place]andra Bevels   1 year ago Nasal sore   [facility] [place], [place]

## 2019-10-28 ENCOUNTER — Ambulatory Visit
Admission: RE | Admit: 2019-10-28 | Discharge: 2019-10-28 | Disposition: A | Discharge status: Home / Self Care | Payer: BC Managed Care – PPO | Source: Ambulatory Visit | Attending: Physician Assistant | Admitting: Physician Assistant

## 2019-10-28 ENCOUNTER — Other Ambulatory Visit: Payer: Self-pay

## 2019-10-28 DIAGNOSIS — N644 Mastodynia: Secondary | ICD-10-CM

## 2019-11-01 ENCOUNTER — Other Ambulatory Visit: Payer: Self-pay | Admitting: Physician Assistant

## 2019-11-01 DIAGNOSIS — J3489 Other specified disorders of nose and nasal sinuses: Secondary | ICD-10-CM

## 2019-11-01 NOTE — Telephone Encounter (Signed)
Requested medication (s) are due for refill today: yes  Requested medication (s) are on the active medication list: yes  Last refill:  12/26/18  Future visit scheduled: no  Notes to clinic:  med not assigned to a protocol   Requested Prescriptions  Pending Prescriptions Disp Refills   mupirocin ointment (BACTROBAN) 2 % [Pharmacy Med Name: MUPIROCIN 2% OINTMENT 22GM] 22 g 0    Sig: APPLY 1 APPLICATION INTO THE NOSE TWICE DAILY FOR 5 TO 7 DAYS      Off-Protocol Failed - 11/01/2019  5:12 PM      Failed - Medication not assigned to a protocol, review manually.      Passed - Valid encounter within last 12 months    Recent Outpatient Visits           2 weeks ago Breast pain, left   [facility] Margaretann Loveless, [place]   11 months ago Annual physical exam   [facility] [place], Alessandra Bevels, [place]   1 year ago Acute non-recurrent pansinusitis   [facility] [place], [place]andra Bevel[place], [place]   1 year ago Scalp abscess   [facility] [place], [place]essandra Bevels, [place]   1 year ago Nasal sore   [facility] Joycelyn Man [place], [place]

## 2019-12-01 ENCOUNTER — Encounter: Payer: Self-pay | Admitting: Podiatry

## 2019-12-01 ENCOUNTER — Ambulatory Visit (INDEPENDENT_AMBULATORY_CARE_PROVIDER_SITE_OTHER): Payer: BC Managed Care – PPO

## 2019-12-01 ENCOUNTER — Other Ambulatory Visit: Payer: Self-pay

## 2019-12-01 ENCOUNTER — Other Ambulatory Visit: Payer: Self-pay | Admitting: Podiatry

## 2019-12-01 ENCOUNTER — Ambulatory Visit: Payer: BC Managed Care – PPO | Admitting: Podiatry

## 2019-12-01 DIAGNOSIS — M722 Plantar fascial fibromatosis: Secondary | ICD-10-CM

## 2019-12-01 DIAGNOSIS — M778 Other enthesopathies, not elsewhere classified: Secondary | ICD-10-CM

## 2019-12-01 DIAGNOSIS — M7751 Other enthesopathy of right foot: Secondary | ICD-10-CM

## 2019-12-01 MED ORDER — MELOXICAM 15 MG PO TABS: 15.0000 mg | ORAL_TABLET | Freq: Every day | ORAL | 3 refills | Status: DC

## 2019-12-01 MED ORDER — TRIAMCINOLONE ACETONIDE 40 MG/ML IJ SUSP
20.0000 mg | Freq: Once | INTRAMUSCULAR | Status: AC
  Administered 2019-12-01: 20 mg

## 2019-12-01 MED ORDER — METHYLPREDNISOLONE 4 MG PO TBPK: ORAL_TABLET | ORAL | 0 refills | Status: DC

## 2019-12-01 NOTE — Progress Notes (Signed)
She presents today with several weeks of painful ankle right.  She states that she hurts in the forefoot the medial ankle.  Denies any trauma.  Objective: Vital signs stable alert oriented x3.  Pulses are palpable.  At this point she has pain on palpation medial calcaneal tubercle of the right heel.  Pulses are palpable.  She has some reproducible pain on palpation with hammertoe deformity second metatarsophalangeal joint right foot.  No calf pain no Achilles pain.  Radiographs taken today demonstrate an osseously mature individual with an old plantar distally oriented calcaneal spur which appears to be thick with soft tissue increase in density around the plantar calcaneal insertion site.  Assessment: Plantar fasciitis right foot resulting in some posterior tibial tendinitis and forefoot metatarsalgia capsulitis with hammertoe deformity second right.  Plan: Discussed etiology pathology conservative surgical therapies this point time went ahead and performed a injection to the right heel start her on methylprednisolone to be followed by meloxicam.  Follow-up with her in 6 weeks and also provided her with plantar fascial brace.

## 2020-01-05 ENCOUNTER — Other Ambulatory Visit: Payer: Self-pay | Admitting: Physician Assistant

## 2020-01-05 ENCOUNTER — Ambulatory Visit: Payer: Self-pay

## 2020-01-05 MED ORDER — AMOXICILLIN-POT CLAVULANATE 875-125 MG PO TABS: 1.0000 | ORAL_TABLET | Freq: Two times a day (BID) | ORAL | 0 refills | Status: DC

## 2020-01-05 NOTE — Progress Notes (Signed)
Augmentin sent for sinus infection

## 2020-01-05 NOTE — Telephone Encounter (Signed)
Patient advised as below.  

## 2020-01-05 NOTE — Telephone Encounter (Signed)
Will send in augmentin for sinus infection

## 2020-01-05 NOTE — Telephone Encounter (Signed)
Are symptoms similar to previous ear infections/sinus infections she has had?

## 2020-01-05 NOTE — Telephone Encounter (Signed)
Patient reports left ear pain more than right started Saturday. Patient reports sinus pain and pressure under eyes, swelling, and tender to the touch. Patient denies fever, cough or discharge from ear. Patient taking mucinex and tylenol reports mild symptom control.

## 2020-01-05 NOTE — Telephone Encounter (Signed)
Pt. Reports she started having dizziness and ear pain lest week. Reports dizziness is mainly when she changes position. No availability with her PCP and pt. Prefers to see her. Offered appointment with another provider, declines.Please advise.  Reason for Disposition . [1] MODERATE dizziness (e.g., interferes with normal activities) AND [2] has NOT been evaluated by physician for this  (Exception: dizziness caused by heat exposure, sudden standing, or poor fluid intake)  Answer Assessment - Initial Assessment Questions 1. DESCRIPTION: "Describe your dizziness."     Last week 2. LIGHTHEADED: "Do you feel lightheaded?" (e.g., somewhat faint, woozy, weak upon standing)     Yes 3. VERTIGO: "Do you feel like either you or the room is spinning or tilting?" (i.e. vertigo)     No 4. SEVERITY: "How bad is it?"  "Do you feel like you are going to faint?" "Can you stand and walk?"   - MILD: Feels slightly dizzy, but walking normally.   - MODERATE: Feels very unsteady when walking, but not falling; interferes with normal activities (e.g., school, work) .   - SEVERE: Unable to walk without falling, or requires assistance to walk without falling; feels like passing out now.      Mild 5. ONSET:  "When did the dizziness begin?"     Last week 6. AGGRAVATING FACTORS: "Does anything make it worse?" (e.g., standing, change in head position)     Standing 7. HEART RATE: "Can you tell me your heart rate?" "How many beats in 15 seconds?"  (Note: not all patients can do this)       No 8. CAUSE: "What do you think is causing the dizziness?"     Unsure 9. RECURRENT SYMPTOM: "Have you had dizziness before?" If Yes, ask: "When was the last time?" "What happened that time?"     No 10. OTHER SYMPTOMS: "Do you have any other symptoms?" (e.g., fever, chest pain, vomiting, diarrhea, bleeding)       Ear pain 11. PREGNANCY: "Is there any chance you are pregnant?" "When was your last menstrual period?"        No  Protocols used: DIZZINESS [facility]

## 2020-01-06 ENCOUNTER — Other Ambulatory Visit: Payer: BC Managed Care – PPO

## 2020-01-12 ENCOUNTER — Encounter: Payer: Self-pay | Admitting: Podiatry

## 2020-01-12 ENCOUNTER — Other Ambulatory Visit: Payer: Self-pay

## 2020-01-12 ENCOUNTER — Ambulatory Visit: Payer: BC Managed Care – PPO | Admitting: Podiatry

## 2020-01-12 DIAGNOSIS — M722 Plantar fascial fibromatosis: Secondary | ICD-10-CM

## 2020-01-12 DIAGNOSIS — M2141 Flat foot [pes planus] (acquired), right foot: Secondary | ICD-10-CM

## 2020-01-12 DIAGNOSIS — M778 Other enthesopathies, not elsewhere classified: Secondary | ICD-10-CM

## 2020-01-12 DIAGNOSIS — M2142 Flat foot [pes planus] (acquired), left foot: Secondary | ICD-10-CM

## 2020-01-12 NOTE — Progress Notes (Signed)
She presents today states that her plantar fasciitis posterior tibial tendinitis has completely resolved.  She does relate that there is some tenderness between the second and third metatarsophalangeal joints.  She has tenderness on palpation and range of motion of the second metatarsophalangeal joint but much improved.  Objective: Vital signs are stable she is alert and oriented x3.  Pulses are palpable.  No reproducible pain on palpation of the posterior tibial tendon or on inversion against resistance.  She has no pain on palpation medial calcaneal tubercle.  Flatfoot deformity is still present.  She has some mild hammertoe deformity second right.  Mild tenderness on end range of motion of the second metatarsophalangeal joint.  Assessment: Well-healing posterior tibial tendinitis pes planus plantar fasciitis and capsulitis.  Plan: Follow-up with her as needed.

## 2020-01-24 ENCOUNTER — Other Ambulatory Visit: Payer: Self-pay | Admitting: Physician Assistant

## 2020-01-24 DIAGNOSIS — E782 Mixed hyperlipidemia: Secondary | ICD-10-CM

## 2020-01-30 ENCOUNTER — Encounter: Payer: BC Managed Care – PPO | Admitting: Physician Assistant

## 2020-02-13 ENCOUNTER — Encounter: Payer: Self-pay | Admitting: Physician Assistant

## 2020-02-17 ENCOUNTER — Ambulatory Visit (INDEPENDENT_AMBULATORY_CARE_PROVIDER_SITE_OTHER): Payer: BC Managed Care – PPO | Admitting: Physician Assistant

## 2020-02-17 ENCOUNTER — Other Ambulatory Visit: Payer: Self-pay

## 2020-02-17 ENCOUNTER — Encounter: Payer: Self-pay | Admitting: Physician Assistant

## 2020-02-17 VITALS — BP 136/83 | HR 106 | Temp 98.5°F | Ht 63.0 in | Wt 253.0 lb

## 2020-02-17 DIAGNOSIS — H6983 Other specified disorders of Eustachian tube, bilateral: Secondary | ICD-10-CM | POA: Diagnosis not present

## 2020-02-17 DIAGNOSIS — Z1211 Encounter for screening for malignant neoplasm of colon: Secondary | ICD-10-CM | POA: Diagnosis not present

## 2020-02-17 DIAGNOSIS — I10 Essential (primary) hypertension: Secondary | ICD-10-CM | POA: Diagnosis not present

## 2020-02-17 DIAGNOSIS — Z114 Encounter for screening for human immunodeficiency virus [HIV]: Secondary | ICD-10-CM

## 2020-02-17 DIAGNOSIS — Z1159 Encounter for screening for other viral diseases: Secondary | ICD-10-CM

## 2020-02-17 DIAGNOSIS — E782 Mixed hyperlipidemia: Secondary | ICD-10-CM

## 2020-02-17 DIAGNOSIS — Z6841 Body Mass Index (BMI) 40.0 and over, adult: Secondary | ICD-10-CM

## 2020-02-17 DIAGNOSIS — Z0000 Encounter for general adult medical examination without abnormal findings: Secondary | ICD-10-CM

## 2020-02-17 DIAGNOSIS — R7303 Prediabetes: Secondary | ICD-10-CM

## 2020-02-17 MED ORDER — PREDNISONE 10 MG PO TABS: ORAL_TABLET | ORAL | 0 refills | Status: DC

## 2020-02-17 NOTE — Patient Instructions (Signed)
Preventive Care 52-52 Years Old, Female Preventive care refers to lifestyle choices and visits with your health care provider that can promote health and wellness. This includes:  A yearly physical exam. This is also called an annual wellness visit.  Regular dental and eye exams.  Immunizations.  Screening for certain conditions.  Healthy lifestyle choices, such as: ? Eating a healthy diet. ? Getting regular exercise. ? Not using drugs or products that contain nicotine and tobacco. ? Limiting alcohol use. What can I expect for my preventive care visit? Physical exam Your health care provider will check your:  Height and weight. These may be used to calculate your BMI (body mass index). BMI is a measurement that tells if you are at a healthy weight.  Heart rate and blood pressure.  Body temperature.  Skin for abnormal spots. Counseling Your health care provider may ask you questions about your:  Past medical problems.  Family's medical history.  Alcohol, tobacco, and drug use.  Emotional well-being.  Home life and relationship well-being.  Sexual activity.  Diet, exercise, and sleep habits.  Work and work Statistician.  Access to firearms.  Method of birth control.  Menstrual cycle.  Pregnancy history. What immunizations do I need? Vaccines are usually given at various ages, according to a schedule. Your health care provider will recommend vaccines for you based on your age, medical history, and lifestyle or other factors, such as travel or where you work.   What tests do I need? Blood tests  Lipid and cholesterol levels. These may be checked every 5 years, or more often if you are over 3 years old.  Hepatitis C test.  Hepatitis B test. Screening  Lung cancer screening. You may have this screening every year starting at age 52 if you have a 30-pack-year history of smoking and currently smoke or have quit within the past 15 years.  Colorectal cancer  screening. ? All adults should have this screening starting at age 52 and continuing until age 17. ? Your health care provider may recommend screening at age 49 if you are at increased risk. ? You will have tests every 1-10 years, depending on your results and the type of screening test.  Diabetes screening. ? This is done by checking your blood sugar (glucose) after you have not eaten for a while (fasting). ? You may have this done every 1-3 years.  Mammogram. ? This may be done every 1-2 years. ? Talk with your health care provider about when you should start having regular mammograms. This may depend on whether you have a family history of breast cancer.  BRCA-related cancer screening. This may be done if you have a family history of breast, ovarian, tubal, or peritoneal cancers.  Pelvic exam and Pap test. ? This may be done every 3 years starting at age 52. ? Starting at age 52, this may be done every 5 years if you have a Pap test in combination with an HPV test. Other tests  STD (sexually transmitted disease) testing, if you are at risk.  Bone density scan. This is done to screen for osteoporosis. You may have this scan if you are at high risk for osteoporosis. Talk with your health care provider about your test results, treatment options, and if necessary, the need for more tests. Follow these instructions at home: Eating and drinking  Eat a diet that includes fresh fruits and vegetables, whole grains, lean protein, and low-fat dairy products.  Take vitamin and mineral supplements  as recommended by your health care provider.  Do not drink alcohol if: ? Your health care provider tells you not to drink. ? You are pregnant, may be pregnant, or are planning to become pregnant.  If you drink alcohol: ? Limit how much you have to 0-1 drink a day. ? Be aware of how much alcohol is in your drink. In the [place] one drink equals one 12 oz bottle of beer (355 mL), one 5 oz glass of  wine (148 mL), or one 1 oz glass of hard liquor (44 mL).   Lifestyle  Take daily care of your teeth and gums. Brush your teeth every morning and night with fluoride toothpaste. Floss one time each day.  Stay active. Exercise for at least 30 minutes 5 or more days each week.  Do not use any products that contain nicotine or tobacco, such as cigarettes, e-cigarettes, and chewing tobacco. If you need help quitting, ask your health care provider.  Do not use drugs.  If you are sexually active, practice safe sex. Use a condom or other form of protection to prevent STIs (sexually transmitted infections).  If you do not wish to become pregnant, use a form of birth control. If you plan to become pregnant, see your health care provider for a prepregnancy visit.  If told by your health care provider, take low-dose aspirin daily starting at age 52.  Find healthy ways to cope with stress, such as: ? Meditation, yoga, or listening to music. ? Journaling. ? Talking to a trusted person. ? Spending time with friends and family. Safety  Always wear your seat belt while driving or riding in a vehicle.  Do not drive: ? If you have been drinking alcohol. Do not ride with someone who has been drinking. ? When you are tired or distracted. ? While texting.  Wear a helmet and other protective equipment during sports activities.  If you have firearms in your house, make sure you follow all gun safety procedures. What's next?  Visit your health care provider once a year for an annual wellness visit.  Ask your health care provider how often you should have your eyes and teeth checked.  Stay up to date on all vaccines. This information is not intended to replace advice given to you by your health care provider. Make sure you discuss any questions you have with your health care provider. Document Revised: 09/30/2019 Document Reviewed: 09/06/2017 Elsevier Patient Education  2021 [employer]  

## 2020-02-17 NOTE — Progress Notes (Signed)
Complete physical exam   Patient: Erin Frye   DOB: 1970/06/12   50 y.o. Female  MRN: [id number] Visit Date: 02/17/2020  Today's healthcare provider: Margaretann Loveless, PA-C   Chief Complaint  Patient presents with  . Annual Exam   Subjective    Erin Frye is a 50 y.o. female who presents today for a complete physical exam.  She reports consuming a general diet. The patient has a physically strenuous job, but has no regular exercise apart from work.  She generally feels well. She reports sleeping well. She does not have additional problems to discuss today.  HPI    Past Medical History:  Diagnosis Date  . Hypertension    Past Surgical History:  Procedure Laterality Date  . BREAST BIOPSY Right 11/22/2018   Affirm bx-"X" clip-path pending  . BREAST BIOPSY Left 11/22/2018   [place] bx venus clip path pending  . CESAREAN SECTION  2004  . GALLBLADDER SURGERY  2007   Social History   Socioeconomic History  . Marital status: Married    Spouse name: Not on file  . Number of children: Not on file  . Years of education: Not on file  . Highest education level: Not on file  Occupational History  . Not on file  Tobacco Use  . Smoking status: Never Smoker  . Smokeless tobacco: Never Used  Vaping Use  . Vaping Use: Never used  Substance and Sexual Activity  . Alcohol use: Yes    Alcohol/week: 0.0 standard drinks    Comment: rare  . Drug use: No  . Sexual activity: Not on file  Other Topics Concern  . Not on file  Social History Narrative  . Not on file   Social Determinants of Health   Financial Resource Strain: Not on file  Food Insecurity: Not on file  Transportation Needs: Not on file  Physical Activity: Not on file  Stress: Not on file  Social Connections: Not on file  Intimate Partner Violence: Not on file   Family Status  Relation Name Status  . Mother  Deceased       02/16/2016  . Father  Other       Unknown  . Brother   Alive  . MGM  Deceased  . Neg Hx  (Not Specified)   Family History  Problem Relation Age of Onset  . Diabetes Mother   . Breast cancer Mother   . COPD Mother   . Kidney failure Mother   . Hypertension Father   . Healthy Brother   . Hypertension Brother   . Breast cancer Maternal Grandmother   . Colon cancer Neg Hx   . Ovarian cancer Neg Hx    Allergies  Allergen Reactions  . Codeine     itching    Patient Care Team: Reine Just as PCP - General (Family Medicine)   Medications: Outpatient Medications Prior to Visit  Medication Sig  . Biotin 89381 MCG TABS Take by mouth.  . ferrous sulfate 325 (65 FE) MG tablet Take by mouth.  . fluticasone (FLONASE) 50 MCG/ACT nasal spray Place into the nose.  . losartan-hydrochlorothiazide (HYZAAR) 100-25 MG tablet Take 1 tablet by mouth daily.  . MULTIPLE VITAMIN PO Take by mouth.  . mupirocin ointment (BACTROBAN) 2 % APPLY 1 APPLICATION INTO THE NOSE TWICE DAILY FOR 5 TO 7 DAYS  . simvastatin (ZOCOR) 20 MG tablet TAKE 1 TABLET BY MOUTH EVERY EVENING  . traZODone (  DESYREL) 50 MG tablet TAKE 1 TO 2 TABLETS BY MOUTH AT BEDTIME AS NEEDED FOR SLEEP  . amoxicillin-clavulanate (AUGMENTIN) 875-125 MG tablet Take 1 tablet by mouth 2 (two) times daily.  . meloxicam (MOBIC) 15 MG tablet Take 1 tablet (15 mg total) by mouth daily.  . methylPREDNISolone (MEDROL DOSEPAK) 4 MG TBPK tablet 6 day dose pack - take as directed   No facility-administered medications prior to visit.    Review of Systems  Constitutional: Negative.   HENT: Negative.   Eyes: Negative.   Respiratory: Negative.   Cardiovascular: Negative.   Gastrointestinal: Negative.   Endocrine: Negative.   Genitourinary: Negative.   Musculoskeletal: Positive for arthralgias and joint swelling. Negative for back pain, gait problem, myalgias, neck pain and neck stiffness.  Skin: Negative.   Allergic/Immunologic: Negative.   Neurological: Negative.   Hematological:  Negative.   Psychiatric/Behavioral: Negative.       Objective    BP 136/83 (BP Location: Left Arm, Patient Position: Sitting, Cuff Size: Large)   Pulse (!) 106   Temp 98.5 F (36.9 C) (Oral)   Ht 5\' 3"  (1.6 m)   Wt 253 lb (114.8 kg)   LMP 04/02/2016 (Exact Date)   BMI 44.82 kg/m    Physical Exam Vitals reviewed.  Constitutional:      General: She is not in acute distress.    Appearance: Normal appearance. She is well-developed and well-nourished. She is obese. She is not ill-appearing or diaphoretic.  HENT:     Head: Normocephalic and atraumatic.     Right Ear: Tympanic membrane, ear canal and external ear normal.     Left Ear: Tympanic membrane, ear canal and external ear normal.     Mouth/Throat:     Mouth: Oropharynx is clear and moist.  Eyes:     General: No scleral icterus.       Right eye: No discharge.        Left eye: No discharge.     Extraocular Movements: Extraocular movements intact and EOM normal.     Conjunctiva/sclera: Conjunctivae normal.     Pupils: Pupils are equal, round, and reactive to light.  Neck:     Thyroid: No thyromegaly.     Vascular: No carotid bruit or JVD.     Trachea: No tracheal deviation.  Cardiovascular:     Rate and Rhythm: Normal rate and regular rhythm.     Pulses: Normal pulses and intact distal pulses.     Heart sounds: Normal heart sounds. No murmur heard. No friction rub. No gallop.   Pulmonary:     Effort: Pulmonary effort is normal. No respiratory distress.     Breath sounds: Normal breath sounds. No wheezing or rales.  Chest:     Chest wall: No tenderness.  Abdominal:     General: Abdomen is flat. Bowel sounds are normal. There is no distension.     Palpations: Abdomen is soft. There is no mass.     Tenderness: There is no abdominal tenderness. There is no guarding or rebound.  Musculoskeletal:        General: No tenderness or edema. Normal range of motion.     Cervical back: Normal range of motion and neck supple.  No tenderness.     Right lower leg: No edema.     Left lower leg: No edema.  Lymphadenopathy:     Cervical: No cervical adenopathy.  Skin:    General: Skin is warm and dry.     Capillary  Refill: Capillary refill takes less than 2 seconds.     Findings: No rash.  Neurological:     General: No focal deficit present.     Mental Status: She is alert and oriented to person, place, and time. Mental status is at baseline.  Psychiatric:        Mood and Affect: Mood and affect and mood normal.        Behavior: Behavior normal.        Thought Content: Thought content normal.        Judgment: Judgment normal.     Last depression screening scores PHQ 2/9 Scores 12/04/2018 05/23/2018 05/02/2017  PHQ - 2 Score 0 0 0  PHQ- 9 Score 0 - 3   Last fall risk screening Fall Risk  12/04/2018  Falls in the past year? 1  Number falls in past yr: 0  Injury with Fall? 0  Follow up Falls evaluation completed   Last Audit-C alcohol use screening Alcohol Use Disorder Test (AUDIT) 12/04/2018  1. How often do you have a drink containing alcohol? 1  2. How many drinks containing alcohol do you have on a typical day when you are drinking? 0  3. How often do you have six or more drinks on one occasion? 0  AUDIT-C Score 1  Alcohol Brief Interventions/Follow-up AUDIT Score <7 follow-up not indicated   A score of 3 or more in women, and 4 or more in men indicates increased risk for alcohol abuse, EXCEPT if all of the points are from question 1   No results found for any visits on 02/17/20.  Assessment & Plan    Routine Health Maintenance and Physical Exam  Exercise Activities and Dietary recommendations Goals   None     Immunization History  Administered Date(s) Administered  . Influenza Inj Mdck Quad Pf 10/19/2018  . Influenza Split 10/04/2019  . Influenza-Unspecified 10/19/2014, 10/15/2016, 10/19/2017  . Moderna Sars-Covid-2 Vaccination 03/08/2019, 04/05/2019, 11/09/2019    Health Maintenance   Topic Date Due  . Hepatitis C Screening  Never done  . HIV Screening  Never done  . TETANUS/TDAP  Never done  . COLONOSCOPY (Pts 45-67yrs Insurance coverage will need to be confirmed)  Never done  . PAP SMEAR-Modifier  08/30/2020  . MAMMOGRAM  10/27/2021  . INFLUENZA VACCINE  Completed  . COVID-19 Vaccine  Completed    Discussed health benefits of physical activity, and encouraged her to engage in regular exercise appropriate for her age and condition.  1. Annual physical exam Normal physical exam today. Will check labs as below and f/u pending lab results. If labs are stable and WNL she will not need to have these rechecked for one year at her next annual physical exam. She is to call the office in the meantime if she has any acute issue, questions or concerns. - CBC w/Diff/Platelet - Comprehensive Metabolic Panel (CMET) - TSH - Lipid Panel With LDL/HDL Ratio - HgB A1c  2. Colon cancer screening Patient due for colon cancer screen. No previous colonoscopy. No family history of colon cancer. Son does have UC. Patient prefers cologuard.  - Cologuard  3. Class 3 severe obesity due to excess calories with serious comorbidity and body mass index (BMI) of 40.0 to 44.9 in adult [facility]) Counseled patient on healthy lifestyle modifications including dieting and exercise.  Will check labs as below and f/u pending results. - CBC w/Diff/Platelet - Comprehensive Metabolic Panel (CMET) - TSH - Lipid Panel With LDL/HDL Ratio - HgB  A1c  4. Essential hypertension Stable. Continue losartan-HCTZ 100-25mg . Will check labs as below and f/u pending results. - CBC w/Diff/Platelet - Comprehensive Metabolic Panel (CMET) - TSH - Lipid Panel With LDL/HDL Ratio - HgB A1c  5. Prediabetes Diet controlled. Will check labs as below and f/u pending results. - CBC w/Diff/Platelet - Comprehensive Metabolic Panel (CMET) - TSH - Lipid Panel With LDL/HDL Ratio - HgB A1c  6. Hypercholesterolemia with  hypertriglyceridemia Stable. Continue Simvastatin 20mg . Will check labs as below and f/u pending results. - CBC w/Diff/Platelet - Comprehensive Metabolic Panel (CMET) - TSH - Lipid Panel With LDL/HDL Ratio - HgB A1c  7. Encounter for hepatitis C screening test for low risk patient Will check labs as below and f/u pending results. - Hepatitis C Antibody  8. Screening for HIV without presence of risk factors Will check labs as below and f/u pending results. - HIV antibody (with reflex)  9. Dysfunction of both eustachian tubes Noted bilaterally with R>L. Patient has been using Flonase without relief. Will change to oral steroid as below. Call if not improving.  - predniSONE (DELTASONE) 10 MG tablet; Take 6 tablets PO on day 1 and day 2, take 5 tablets PO on day 3 and day 4, take 4 tablets PO on day 5 and day 6, take 3 tablets PO on day 7 and day 8, take 2 tablets PO on day 9 and day 10, take one tablet PO on day 11 and day 12.  Dispense: 42 tablet; Refill: 0   No follow-ups on file.     , PA-C, have reviewed all documentation for this visit. The documentation on 02/17/20 for the exam, diagnosis, procedures, and orders are all accurate and complete.   04/16/20  [facility] [phone] (phone) [phone] (fax)  [facility]

## 2020-02-18 LAB — CBC WITH DIFFERENTIAL/PLATELET
Basophils Absolute: 0.1 10*3/uL (ref 0.0–0.2)
Basos: 1 %
EOS (ABSOLUTE): 0.3 10*3/uL (ref 0.0–0.4)
Eos: 3 %
Hematocrit: 39.4 % (ref 34.0–46.6)
Hemoglobin: 13.4 g/dL (ref 11.1–15.9)
Immature Grans (Abs): 0 10*3/uL (ref 0.0–0.1)
Immature Granulocytes: 0 %
Lymphocytes Absolute: 3.2 10*3/uL — ABNORMAL HIGH (ref 0.7–3.1)
Lymphs: 25 %
MCH: 31.1 pg (ref 26.6–33.0)
MCHC: 34 g/dL (ref 31.5–35.7)
MCV: 91 fL (ref 79–97)
Monocytes Absolute: 0.8 10*3/uL (ref 0.1–0.9)
Monocytes: 6 %
Neutrophils Absolute: 8.3 10*3/uL — ABNORMAL HIGH (ref 1.4–7.0)
Neutrophils: 65 %
Platelets: 298 10*3/uL (ref 150–450)
RBC: 4.31 x10E6/uL (ref 3.77–5.28)
RDW: 12.4 % (ref 11.7–15.4)
WBC: 12.7 10*3/uL — ABNORMAL HIGH (ref 3.4–10.8)

## 2020-02-18 LAB — TSH: TSH: 2.42 u[IU]/mL (ref 0.450–4.500)

## 2020-02-18 LAB — LIPID PANEL WITH LDL/HDL RATIO
Cholesterol, Total: 251 mg/dL — ABNORMAL HIGH (ref 100–199)
HDL: 55 mg/dL (ref >39)
LDL Chol Calc (NIH): 125 mg/dL — ABNORMAL HIGH (ref 0–99)
LDL/HDL Ratio: 2.3 ratio (ref 0.0–3.2)
Triglycerides: 404 mg/dL — ABNORMAL HIGH (ref 0–149)
VLDL Cholesterol Cal: 71 mg/dL — ABNORMAL HIGH (ref 5–40)

## 2020-02-18 LAB — HIV ANTIBODY (ROUTINE TESTING W REFLEX): HIV Screen 4th Generation wRfx: NONREACTIVE

## 2020-02-18 LAB — COMPREHENSIVE METABOLIC PANEL
ALT: 24 IU/L (ref 0–32)
AST: 25 IU/L (ref 0–40)
Albumin/Globulin Ratio: 1.5 (ref 1.2–2.2)
Albumin: 4.6 g/dL (ref 3.8–4.9)
Alkaline Phosphatase: 96 IU/L (ref 44–121)
BUN/Creatinine Ratio: 22 (ref 9–23)
BUN: 13 mg/dL (ref 6–24)
Bilirubin Total: 0.3 mg/dL (ref 0.0–1.2)
CO2: 25 mmol/L (ref 20–29)
Calcium: 9.8 mg/dL (ref 8.7–10.2)
Chloride: 98 mmol/L (ref 96–106)
Creatinine, Ser: 0.59 mg/dL (ref 0.57–1.00)
GFR calc Af Amer: 123 mL/min/{1.73_m2} (ref >59)
GFR calc non Af Amer: 106 mL/min/{1.73_m2} (ref >59)
Globulin, Total: 3.1 g/dL (ref 1.5–4.5)
Glucose: 99 mg/dL (ref 65–99)
Potassium: 4 mmol/L (ref 3.5–5.2)
Sodium: 139 mmol/L (ref 134–144)
Total Protein: 7.7 g/dL (ref 6.0–8.5)

## 2020-02-18 LAB — HEMOGLOBIN A1C
Est. average glucose Bld gHb Est-mCnc: 137 mg/dL
Hgb A1c MFr Bld: 6.4 % — ABNORMAL HIGH (ref 4.8–5.6)

## 2020-02-18 LAB — HEPATITIS C ANTIBODY: Hep C Virus Ab: <0.1 s/co ratio (ref 0.0–0.9)

## 2020-02-21 ENCOUNTER — Other Ambulatory Visit: Payer: Self-pay | Admitting: Physician Assistant

## 2020-02-21 DIAGNOSIS — I10 Essential (primary) hypertension: Secondary | ICD-10-CM

## 2020-02-21 DIAGNOSIS — E782 Mixed hyperlipidemia: Secondary | ICD-10-CM

## 2020-02-29 ENCOUNTER — Encounter: Payer: Self-pay | Admitting: Physician Assistant

## 2020-03-01 ENCOUNTER — Encounter: Payer: Self-pay | Admitting: Physician Assistant

## 2020-03-02 LAB — COLOGUARD: Cologuard: NEGATIVE

## 2020-03-07 LAB — COLOGUARD: COLOGUARD: NEGATIVE

## 2020-03-09 LAB — COLOGUARD: Cologuard: NEGATIVE

## 2020-05-16 ENCOUNTER — Other Ambulatory Visit: Payer: Self-pay | Admitting: Physician Assistant

## 2020-05-16 DIAGNOSIS — G479 Sleep disorder, unspecified: Secondary | ICD-10-CM

## 2020-05-19 ENCOUNTER — Ambulatory Visit: Payer: BC Managed Care – PPO | Admitting: Podiatry

## 2020-05-19 ENCOUNTER — Other Ambulatory Visit: Payer: Self-pay

## 2020-05-19 ENCOUNTER — Encounter: Payer: Self-pay | Admitting: Podiatry

## 2020-05-19 DIAGNOSIS — M778 Other enthesopathies, not elsewhere classified: Secondary | ICD-10-CM | POA: Diagnosis not present

## 2020-05-19 MED ORDER — TRIAMCINOLONE ACETONIDE 40 MG/ML IJ SUSP
20.0000 mg | Freq: Once | INTRAMUSCULAR | Status: AC
Start: 2020-05-19 — End: 2020-05-19
  Administered 2020-05-19: 20 mg

## 2020-05-22 ENCOUNTER — Other Ambulatory Visit: Payer: Self-pay | Admitting: Physician Assistant

## 2020-05-22 DIAGNOSIS — E782 Mixed hyperlipidemia: Secondary | ICD-10-CM

## 2020-05-23 NOTE — Progress Notes (Signed)
She presents today for follow-up of her capsulitis to the second metatarsophalangeal joint of the right foot states that is starting to bother me again.  The ankle is just also starting to swell some to she is feeling some pain in the top of the foot and some occasional numbness around the hallux.  She denies any trauma any new changes in her past medical history medications or allergies.  Objective: Vital signs are stable alert oriented x3.  Pulses are palpable.  She has tenderness on palpation or range of motion of the second metatarsophalangeal joint of the right patient swelling on the plantar aspect of the metatarsophalangeal joint which may be resulting in some of the numbness to the hallux as well.  Assessment chronic capsulitis second metatarsophalangeal joint.  Plan: We injected into the joint today after sterile Betadine skin prep 10 mg of Kenalog 5 mg Marcaine none follow-up  On an as-needed basis.

## 2020-05-24 NOTE — Telephone Encounter (Signed)
  Last refill:  02/21/2020  Future visit scheduled: no  Notes to clinic: review for refill Looks like dose may have been increased    Requested Prescriptions  Pending Prescriptions Disp Refills   simvastatin (ZOCOR) 20 MG tablet [Pharmacy Med Name: SIMVASTATIN 20MG  TABLETS] 90 tablet 0    Sig: TAKE 1 TABLET BY MOUTH EVERY EVENING      Cardiovascular:  Antilipid - Statins Failed - 05/22/2020  4:37 PM      Failed - Total Cholesterol in normal range and within 360 days    Cholesterol, Total  Date Value Ref Range Status  02/17/2020 251 (H) 100 - 199 mg/dL Final          Failed - LDL in normal range and within 360 days    LDL Chol Calc (NIH)  Date Value Ref Range Status  02/17/2020 125 (H) 0 - 99 mg/dL Final          Failed - Triglycerides in normal range and within 360 days    Triglycerides  Date Value Ref Range Status  02/17/2020 404 (H) 0 - 149 mg/dL Final          Passed - HDL in normal range and within 360 days    HDL  Date Value Ref Range Status  02/17/2020 55 >39 mg/dL Final          Passed - Patient is not pregnant      Passed - Valid encounter within last 12 months    Recent Outpatient Visits           3 months ago Annual physical exam   [facility] M, PA-C   7 months ago Breast pain, left   [facility] [place], [place], [place]andra Bevels   1 year ago Annual physical exam   [facility] [place], [place], [place]   1 year ago Acute non-recurrent pansinusitis   [facility] [place], [place], [place]   1 year ago Scalp abscess   [facility] [place], [place], [place]

## 2020-08-14 ENCOUNTER — Other Ambulatory Visit: Payer: Self-pay | Admitting: Physician Assistant

## 2020-08-14 DIAGNOSIS — I10 Essential (primary) hypertension: Secondary | ICD-10-CM

## 2020-08-17 ENCOUNTER — Other Ambulatory Visit: Payer: Self-pay | Admitting: Physician Assistant

## 2020-08-17 ENCOUNTER — Other Ambulatory Visit: Payer: Self-pay | Admitting: Family Medicine

## 2020-08-17 DIAGNOSIS — I10 Essential (primary) hypertension: Secondary | ICD-10-CM

## 2020-08-17 MED ORDER — LOSARTAN POTASSIUM-HCTZ 100-25 MG PO TABS: 1.0000 | ORAL_TABLET | Freq: Every day | ORAL | 1 refills | Status: AC

## 2020-08-17 NOTE — Telephone Encounter (Unsigned)
Copied from CRM [id number]. Topic: Quick Communication - Rx Refill/Question >> Aug 17, 2020 10:06 AM Gaetana Michaelis A wrote: Medication: losartan-hydrochlorothiazide (HYZAAR) 100-25 MG tablet [[id number]]   Has the patient contacted their pharmacy? Yes.   (Agent: If no, request that the patient contact the pharmacy for the refill.) (Agent: If yes, when and what did the pharmacy advise?)  Preferred Pharmacy (with phone number or street name): [id number] [place], [place] - [address] AT [facility] [address] [facility]  Phone:  [phone] Fax:  [phone]  Agent: Please be advised that RX refills may take up to 3 business days. We ask that you follow-up with your pharmacy.

## 2020-08-17 NOTE — Telephone Encounter (Signed)
Reordered from what Dr. Beryle Flock sent on yesterday.

## 2020-08-17 NOTE — Telephone Encounter (Signed)
[facility] called and spoke to [place], [facility] about the refill(s) Hyzaar requested. Advised it was sent on 08/16/20 #90/1 refill(s). He says they had a power outage yesterday and did not receive a lot of Rx that were sent, so he asked the patient to request it again.

## 2020-08-21 ENCOUNTER — Other Ambulatory Visit: Payer: Self-pay | Admitting: Family Medicine

## 2020-08-21 DIAGNOSIS — E782 Mixed hyperlipidemia: Secondary | ICD-10-CM

## 2020-08-21 NOTE — Telephone Encounter (Signed)
Requested Prescriptions  Pending Prescriptions Disp Refills  . simvastatin (ZOCOR) 20 MG tablet [Pharmacy Med Name: SIMVASTATIN 20MG  TABLETS] 90 tablet 0    Sig: TAKE 1 TABLET BY MOUTH EVERY EVENING     Cardiovascular:  Antilipid - Statins Failed - 08/21/2020  9:42 AM      Failed - Total Cholesterol in normal range and within 360 days    Cholesterol, Total  Date Value Ref Range Status  02/17/2020 251 (H) 100 - 199 mg/dL Final         Failed - LDL in normal range and within 360 days    LDL Chol Calc (NIH)  Date Value Ref Range Status  02/17/2020 125 (H) 0 - 99 mg/dL Final         Failed - Triglycerides in normal range and within 360 days    Triglycerides  Date Value Ref Range Status  02/17/2020 404 (H) 0 - 149 mg/dL Final         Passed - HDL in normal range and within 360 days    HDL  Date Value Ref Range Status  02/17/2020 55 >39 mg/dL Final         Passed - Patient is not pregnant      Passed - Valid encounter within last 12 months    Recent Outpatient Visits          6 months ago Annual physical exam   [facility] M, PA-C   10 months ago Breast pain, left   [facility] [place], [place], [place]   1 year ago Annual physical exam   [facility] [place], [place], [place]   1 year ago Acute non-recurrent pansinusitis   [facility] [place], [place], [place]   2 years ago Scalp abscess   [facility] [place], [place], [place]

## 2020-08-24 ENCOUNTER — Other Ambulatory Visit: Payer: Self-pay | Admitting: Physician Assistant

## 2020-08-24 DIAGNOSIS — J3489 Other specified disorders of nose and nasal sinuses: Secondary | ICD-10-CM

## 2020-09-04 ENCOUNTER — Other Ambulatory Visit: Payer: Self-pay | Admitting: Family Medicine

## 2020-09-04 DIAGNOSIS — G479 Sleep disorder, unspecified: Secondary | ICD-10-CM

## 2020-09-05 ENCOUNTER — Telehealth: Payer: Self-pay

## 2020-09-05 NOTE — Telephone Encounter (Signed)
Called pt to make appt. Needs med ref and f/u.Pt needed late in the day appt except for 11/19/20. Appt made with Dr. Sherrie Mustache.

## 2020-09-05 NOTE — Telephone Encounter (Signed)
Requested Prescriptions  Pending Prescriptions Disp Refills  . traZODone (DESYREL) 50 MG tablet [Pharmacy Med Name: TRAZODONE 50MG  TABLETS] 180 tablet 0    Sig: TAKE 1 TO 2 TABLETS BY MOUTH AT BEDTIME AS NEEDED FOR SLEEP     Psychiatry: Antidepressants - Serotonin Modulator Failed - 09/04/2020  6:49 PM      Failed - Valid encounter within last 6 months    Recent Outpatient Visits          6 months ago Annual physical exam   [facility] M, M   10 months ago Breast pain, left   [facility] [place], [place], [place]   1 year ago Annual physical exam   [facility] [place], [place], [place]   1 year ago Acute non-recurrent pansinusitis   [facility] [place], [place], [place] Be[place]   2 years ago Scalp abscess   [facility] [place], [place], [place]vel[place]      Future Appointments            In 2 months [place], [place], MD [facility]

## 2020-11-15 ENCOUNTER — Other Ambulatory Visit: Payer: Self-pay | Admitting: Family Medicine

## 2020-11-15 DIAGNOSIS — E782 Mixed hyperlipidemia: Secondary | ICD-10-CM

## 2020-11-15 NOTE — Telephone Encounter (Signed)
Requested medications are due for refill today.  yes  Requested medications are on the active medications list.  yes  Last refill. 08/21/2020  Future visit scheduled.   yes  Notes to clinic.  Per lab note from 2/8/202 it is unclear if pt should be taking 20 or 40 mg daily.

## 2020-11-16 NOTE — Telephone Encounter (Signed)
Left message for clarification on dose.

## 2020-11-19 ENCOUNTER — Ambulatory Visit: Payer: BC Managed Care – PPO | Admitting: Family Medicine

## 2020-11-19 ENCOUNTER — Other Ambulatory Visit: Payer: Self-pay

## 2020-11-19 ENCOUNTER — Encounter: Payer: Self-pay | Admitting: Family Medicine

## 2020-11-19 VITALS — BP 122/77 | HR 99 | Temp 98.3°F | Resp 16 | Wt 246.9 lb

## 2020-11-19 DIAGNOSIS — J0110 Acute frontal sinusitis, unspecified: Secondary | ICD-10-CM | POA: Diagnosis not present

## 2020-11-19 DIAGNOSIS — R7303 Prediabetes: Secondary | ICD-10-CM | POA: Diagnosis not present

## 2020-11-19 DIAGNOSIS — Z6841 Body Mass Index (BMI) 40.0 and over, adult: Secondary | ICD-10-CM

## 2020-11-19 DIAGNOSIS — I10 Essential (primary) hypertension: Secondary | ICD-10-CM | POA: Diagnosis not present

## 2020-11-19 LAB — POCT GLYCOSYLATED HEMOGLOBIN (HGB A1C): Hemoglobin A1C: 6.3 % — AB (ref 4.0–5.6)

## 2020-11-19 MED ORDER — PREDNISONE 10 MG (21) PO TBPK: ORAL_TABLET | ORAL | 0 refills | Status: DC

## 2020-11-19 MED ORDER — DOXYCYCLINE HYCLATE 100 MG PO TABS: 100.0000 mg | ORAL_TABLET | Freq: Two times a day (BID) | ORAL | 0 refills | Status: DC

## 2020-11-19 NOTE — Progress Notes (Signed)
Established patient visit   Patient: Erin Frye   DOB: 1970-07-27   50 y.o. Female  MRN: [id number] Visit Date: 11/19/2020  Today's healthcare provider: Jacky Kindle, FNP   Chief Complaint  Patient presents with   Hypertension   Hyperlipidemia   Prediabetes Follow Up   Sinus Problem   Subjective    Sinus Problem This is a recurrent problem. The current episode started 1 to 4 weeks ago. The problem is unchanged. There has been no fever. Associated symptoms include congestion, ear pain, headaches and sinus pressure. Pertinent negatives include no chills, coughing, diaphoresis, hoarse voice, neck pain, shortness of breath, sneezing, sore throat or swollen glands. (Tooth pain, post nasal drip) Past treatments include antibiotics (Augmentin). The treatment provided moderate relief.   Hypertension, follow-up  BP Readings from Last 3 Encounters:  11/19/20 122/77  02/17/20 136/83  10/13/19 133/78   Wt Readings from Last 3 Encounters:  11/19/20 246 lb 14.4 oz (112 kg)  02/17/20 253 lb (114.8 kg)  10/13/19 247 lb (112 kg)     She was last seen for hypertension 9 months ago.  BP at that visit was 136/83. Management since that visit includes none continue Losartan-HCTZ 100-25mg .  She reports excellent compliance with treatment. She is not having side effects.  She is following a Regular diet. She is not exercising. She does not smoke.  Use of agents associated with hypertension: none.   Outside blood pressures are not being checked. Symptoms: No chest pain No chest pressure  No palpitations No syncope  No dyspnea No orthopnea  No paroxysmal nocturnal dyspnea No lower extremity edema   Pertinent labs: Lab Results  Component Value Date   CHOL 251 (H) 02/17/2020   HDL 55 02/17/2020   LDLCALC 125 (H) 02/17/2020   TRIG 404 (H) 02/17/2020   CHOLHDL 3.4 08/30/2017   Lab Results  Component Value Date   NA 139 02/17/2020   K 4.0 02/17/2020   CREATININE  0.59 02/17/2020   GFRNONAA 106 02/17/2020   GLUCOSE 99 02/17/2020   TSH 2.420 02/17/2020     The 10-year ASCVD risk score (Arnett DK, et al., 2019) is: 2.4%   ---------------------------------------------------------------------------------------------------  Prediabetes, Follow-up  Lab Results  Component Value Date   HGBA1C 6.3 (A) 11/19/2020   HGBA1C 6.4 (H) 02/17/2020   HGBA1C 6.0 (H) 12/04/2018   GLUCOSE 99 02/17/2020   GLUCOSE 89 12/04/2018   GLUCOSE 141 (H) 08/30/2017    Last seen for for this9 months ago.  Management since that visit includes none, diet controlled. Current symptoms include none and have been unchanged.  Prior visit with dietician: no Current diet: in general, a "healthy" diet   Current exercise: walking  Pertinent Labs:    Component Value Date/Time   CHOL 251 (H) 02/17/2020 1645   TRIG 404 (H) 02/17/2020 1645   CHOLHDL 3.4 08/30/2017 1550   CREATININE 0.59 02/17/2020 1645    Wt Readings from Last 3 Encounters:  11/19/20 246 lb 14.4 oz (112 kg)  02/17/20 253 lb (114.8 kg)  10/13/19 247 lb (112 kg)    -----------------------------------------------------------------------------------------  Lipid/Cholesterol, Follow-up  Last lipid panel Other pertinent labs  Lab Results  Component Value Date   CHOL 251 (H) 02/17/2020   HDL 55 02/17/2020   LDLCALC 125 (H) 02/17/2020   TRIG 404 (H) 02/17/2020   CHOLHDL 3.4 08/30/2017   Lab Results  Component Value Date   ALT 24 02/17/2020   AST 25 02/17/2020  PLT 298 02/17/2020   TSH 2.420 02/17/2020     She was last seen for this 9 months ago.  Management since that visit includes none .  She reports excellent compliance with treatment. She is not having side effects.   Symptoms: No chest pain No chest pressure/discomfort  No dyspnea No lower extremity edema  No numbness or tingling of extremity No orthopnea  No palpitations No paroxysmal nocturnal dyspnea  No speech difficulty No syncope    Current diet: in general, a "healthy" diet   Current exercise: walking  The 10-year ASCVD risk score (Arnett DK, et al., 2019) is: 2.4%  ---------------------------------------------------------------------------------------------------     Medications: Outpatient Medications Prior to Visit  Medication Sig   Biotin 37858 MCG TABS Take by mouth.   ferrous sulfate 325 (65 FE) MG tablet Take by mouth.   fluticasone (FLONASE) 50 MCG/ACT nasal spray Place into the nose.   losartan-hydrochlorothiazide (HYZAAR) 100-25 MG tablet Take 1 tablet by mouth daily.   Multiple Vitamins-Minerals (CENTRUM SILVER 50+WOMEN) TABS Take by mouth.   simvastatin (ZOCOR) 20 MG tablet TAKE 1 TABLET BY MOUTH EVERY EVENING   traZODone (DESYREL) 50 MG tablet TAKE 1 TO 2 TABLETS BY MOUTH AT BEDTIME AS NEEDED FOR SLEEP   [DISCONTINUED] MULTIPLE VITAMIN PO Take by mouth.   No facility-administered medications prior to visit.    Review of Systems  Constitutional:  Negative for chills and diaphoresis.  HENT:  Positive for congestion, ear pain and sinus pressure. Negative for hoarse voice, sneezing and sore throat.   Respiratory:  Negative for cough and shortness of breath.   Musculoskeletal:  Negative for neck pain.  Neurological:  Positive for headaches.      Objective    BP 122/77   Pulse 99   Temp 98.3 F (36.8 C) (Oral)   Resp 16   Wt 246 lb 14.4 oz (112 kg)   LMP 04/02/2016 (Exact Date)   SpO2 98%   BMI 43.74 kg/m    Physical Exam Vitals and nursing note reviewed.  Constitutional:      General: She is not in acute distress.    Appearance: Normal appearance. She is obese. She is not ill-appearing, toxic-appearing or diaphoretic.  HENT:     Head: Normocephalic and atraumatic.     Jaw: Tenderness present.     Right Ear: External ear normal. Swelling and tenderness present.     Left Ear: External ear normal. Swelling and tenderness present.     Ears:     Comments: Glands swollen to prevent  complete otoscope entry into ear canal; bilateral    Nose:     Right Sinus: Maxillary sinus tenderness and frontal sinus tenderness present.     Left Sinus: Maxillary sinus tenderness and frontal sinus tenderness present.     Mouth/Throat:     Lips: Pink.     Mouth: Mucous membranes are moist.  Cardiovascular:     Rate and Rhythm: Normal rate and regular rhythm.     Pulses: Normal pulses.     Heart sounds: Normal heart sounds. No murmur heard.   No friction rub. No gallop.  Pulmonary:     Effort: Pulmonary effort is normal. No respiratory distress.     Breath sounds: Normal breath sounds. No stridor. No wheezing, rhonchi or rales.  Chest:     Chest wall: No tenderness.  Abdominal:     General: Bowel sounds are normal.     Palpations: Abdomen is soft.  Musculoskeletal:  General: No swelling, tenderness, deformity or signs of injury. Normal range of motion.     Right lower leg: No edema.     Left lower leg: No edema.  Skin:    General: Skin is warm and dry.     Capillary Refill: Capillary refill takes less than 2 seconds.     Coloration: Skin is not jaundiced or pale.     Findings: No bruising, erythema, lesion or rash.  Neurological:     General: No focal deficit present.     Mental Status: She is alert and oriented to person, place, and time. Mental status is at baseline.     Cranial Nerves: No cranial nerve deficit.     Sensory: No sensory deficit.     Motor: No weakness.     Coordination: Coordination normal.  Psychiatric:        Mood and Affect: Mood normal.        Behavior: Behavior normal.        Thought Content: Thought content normal.        Judgment: Judgment normal.     Results for orders placed or performed in visit on 11/19/20  POCT glycosylated hemoglobin (Hb A1C)  Result Value Ref Range   Hemoglobin A1C 6.3 (A) 4.0 - 5.6 %   HbA1c POC (<> result, manual entry)     HbA1c, POC (prediabetic range)     HbA1c, POC (controlled diabetic range)       Assessment & Plan     Problem List Items Addressed This Visit       Cardiovascular and Mediastinum   Essential hypertension    Chronic, stable Continue diet modification, exercise, and medication        Respiratory   Acute non-recurrent frontal sinusitis    Repeat abx Recommend add allergy medication in to assist with triggers Uses allergra and flonase seasonally      Relevant Medications   doxycycline (VIBRA-TABS) 100 MG tablet   predniSONE (STERAPRED UNI-PAK 21 TAB) 10 MG (21) TBPK tablet     Other   Prediabetes - Primary    Slight improvement in a1c Continue balanced carb meals and purposeful exercise      Relevant Orders   POCT glycosylated hemoglobin (Hb A1C) (Completed)   Class 3 severe obesity due to excess calories with serious comorbidity and body mass index (BMI) of 40.0 to 44.9 in adult (HCC)    BMI 43 -htn -pre-dm Discussed importance of healthy weight management Discussed diet and exercise         Return in about 3 months (around 02/19/2021), or if symptoms worsen or fail to improve, for chonic disease management, T2DM management, HTN management.      Leilani Merl, FNP, have reviewed all documentation for this visit. The documentation on 11/19/20 for the exam, diagnosis, procedures, and orders are all accurate and complete.    Jacky Kindle, FNP  [facility] [phone] (phone) [phone] (fax)  [facility]

## 2020-11-19 NOTE — Assessment & Plan Note (Signed)
Slight improvement in a1c Continue balanced carb meals and purposeful exercise

## 2020-11-19 NOTE — Assessment & Plan Note (Signed)
BMI 43 -htn -pre-dm Discussed importance of healthy weight management Discussed diet and exercise

## 2020-11-19 NOTE — Assessment & Plan Note (Signed)
Repeat abx Recommend add allergy medication in to assist with triggers Uses allergra and flonase seasonally

## 2020-11-19 NOTE — Assessment & Plan Note (Signed)
Chronic, stable Continue diet modification, exercise, and medication

## 2020-11-20 IMAGING — MG MM DIGITAL DIAGNOSTIC UNILAT*L* W/ TOMO W/ CAD
6 series · 6 of 18 positions shown · non-contrast
Comparison: Previous exam(s).

CLINICAL DATA: 49-year-old female for six-month follow-up of benign
LEFT breast mass biopsy demonstrating fibrocystic changes and
apocrine metaplasia. Follow-up for possible sampling error due to
very small size of the biopsied mass.

EXAM:
DIGITAL DIAGNOSTIC LEFT MAMMOGRAM WITH CAD AND TOMO
ULTRASOUND LEFT BREAST

[L MLO synth-2D (1 of 2)]
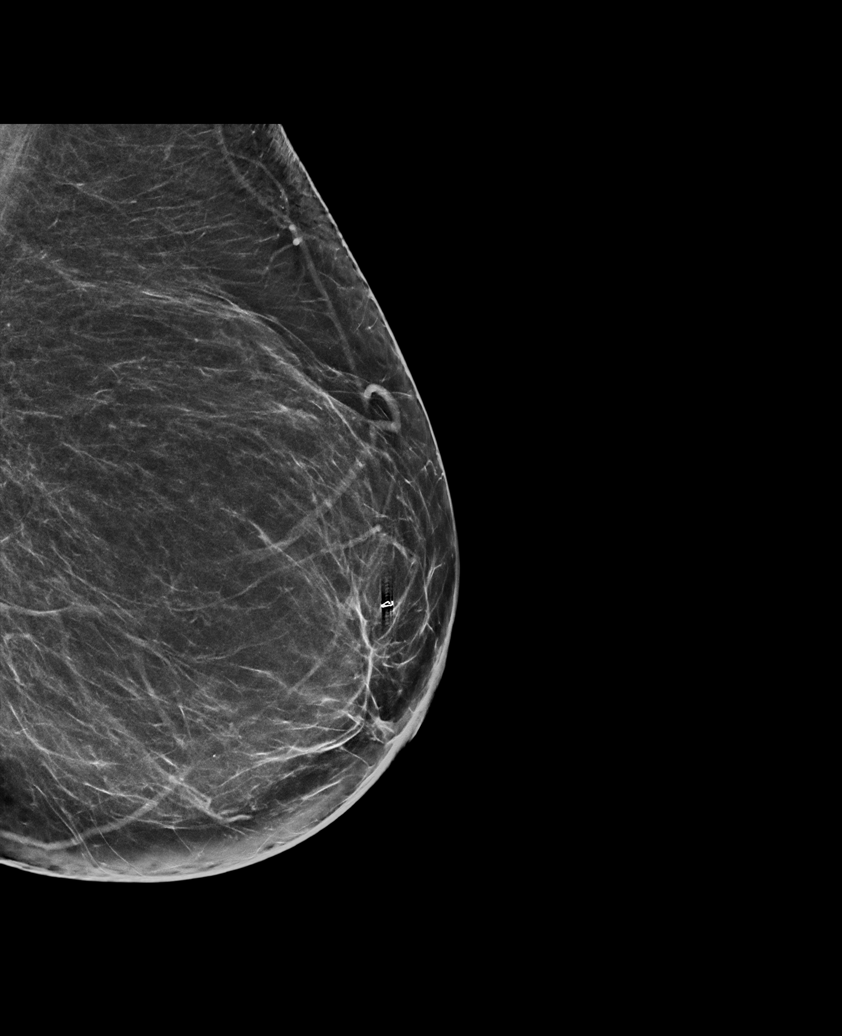

[L MLO synth-2D (2 of 2)]
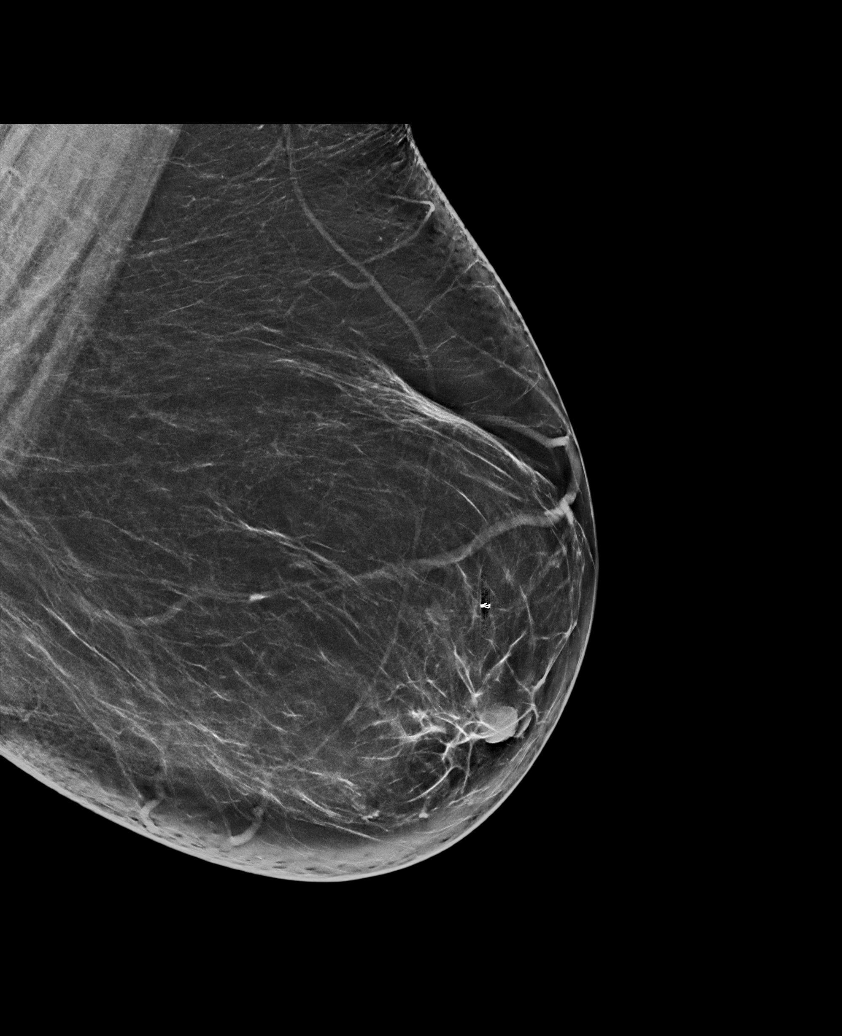

[L CC synth-2D]
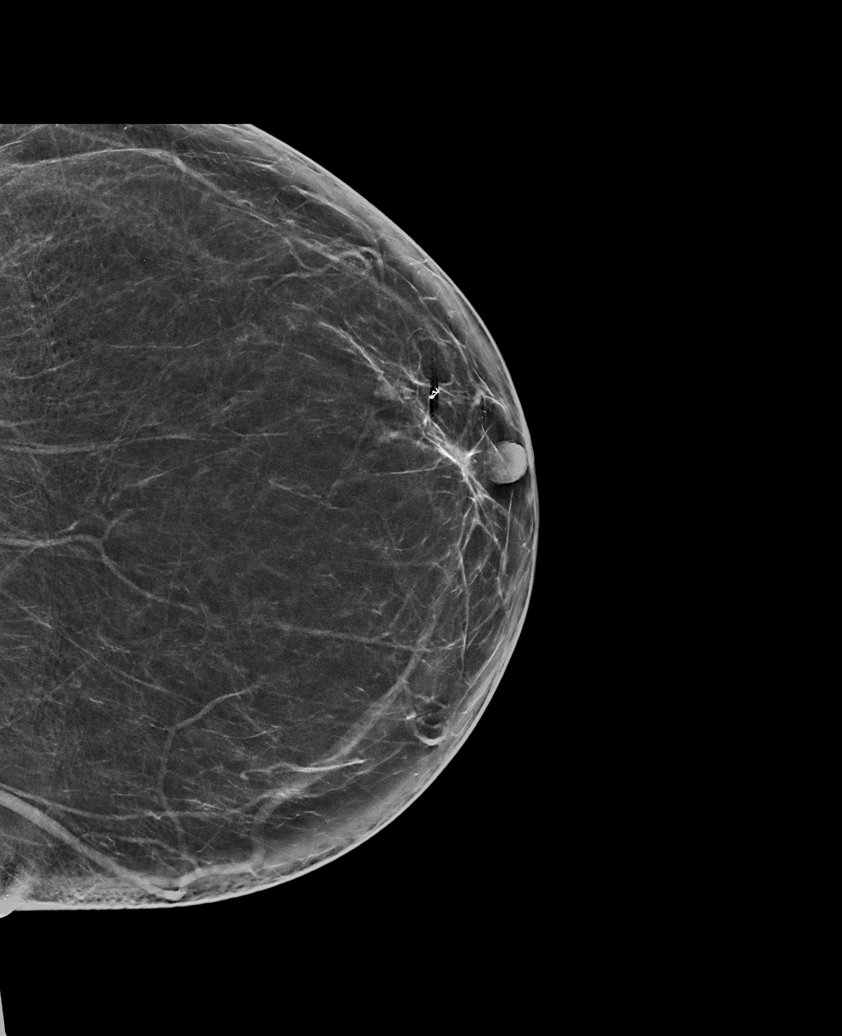

[L MLO tomo (1 of 2) · tomo slice 42/83.0]
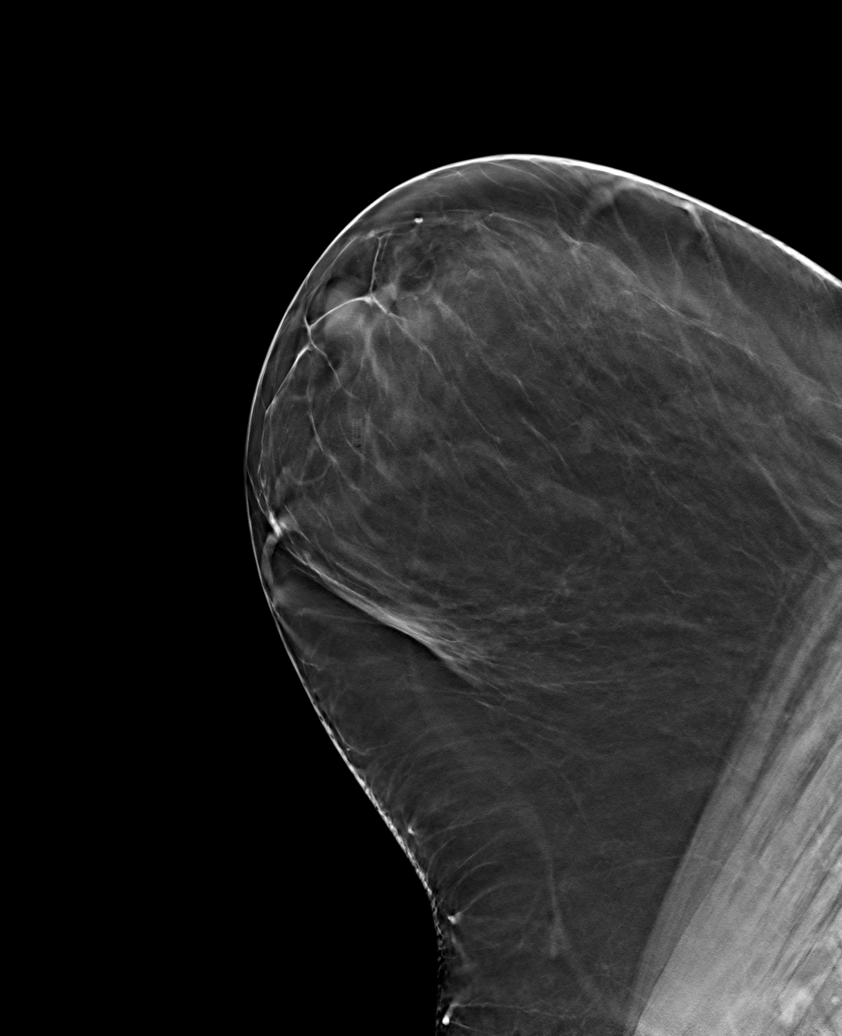

[L CC tomo · tomo slice 39/77.0]
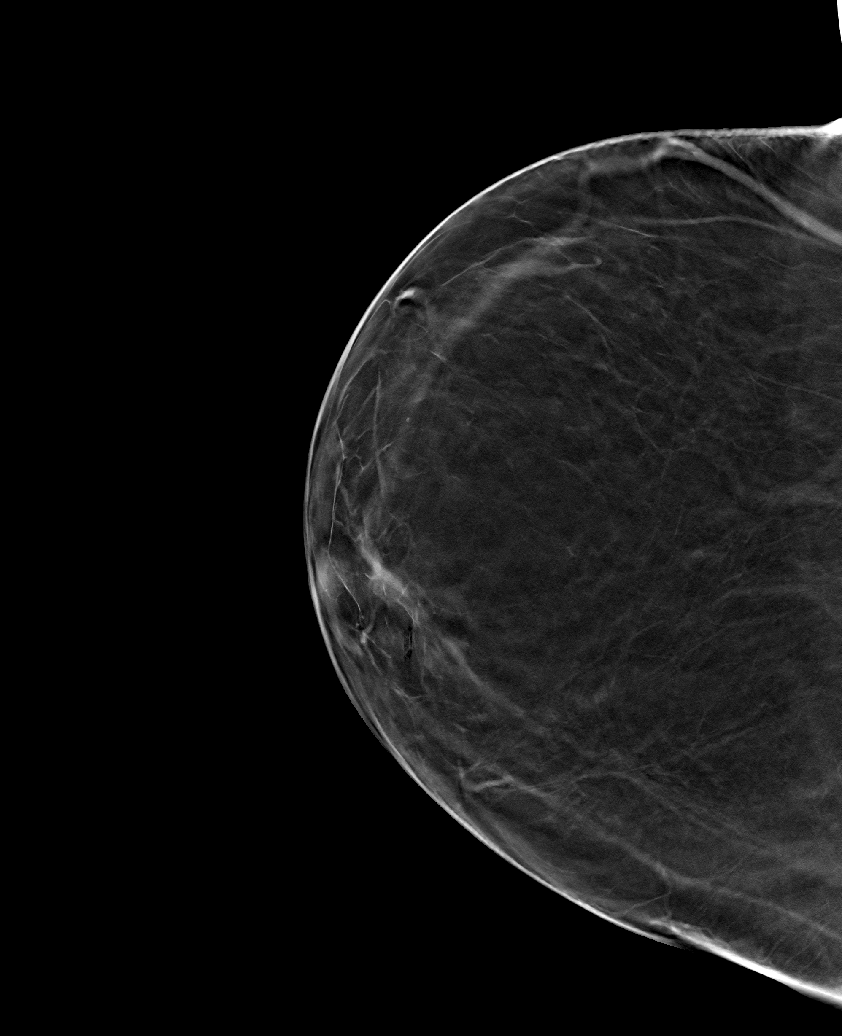

[L MLO tomo (2 of 2) · tomo slice 39/77.0]
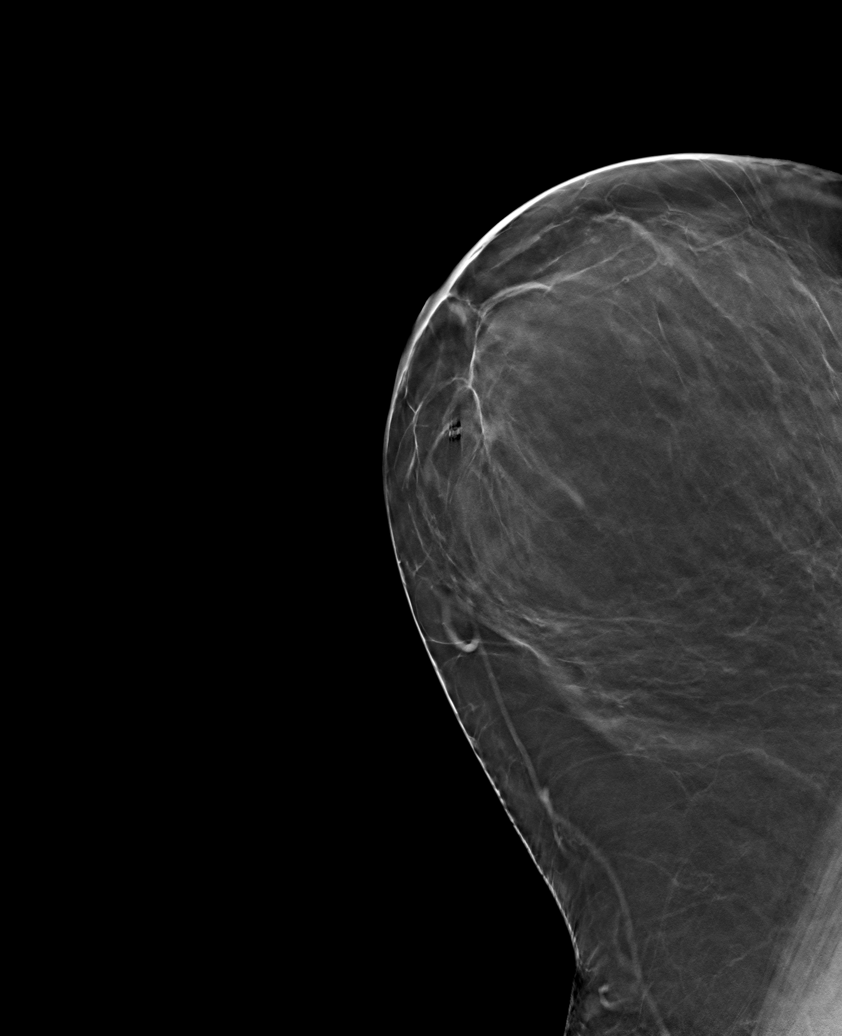

[6 of 18 positions shown; findings below may reference images not displayed]

ACR Breast Density Category b: There are scattered areas of
fibroglandular density.
FINDINGS: 2D/3D full field views of both breasts demonstrate a stable
circumscribed oval mass within the UPPER-OUTER RETROAREOLAR LEFT
breast. A biopsy clip lies 1 cm anterior to this mass.

No new findings are identified.

Mammographic images were processed with CAD.

Targeted ultrasound is performed, showing a 0.4 x 0.3 x 0.4 cm
circumscribed oval mass at the 2 o'clock position of the
RETROAREOLAR LEFT breast with slightly increased internal echoes
from the prior study, which may be due to post biopsy changes.
IMPRESSION: No significant change in size of small circumscribed oval mass, now
with slightly increased internal echoes which most likely is due to
post biopsy changes. However, given the possibility of sampling
error due to small mass size, 1 additional followup in 6 months is
recommended.

RECOMMENDATION:
Bilateral diagnostic mammogram and LEFT breast ultrasound in 6
months.

I have discussed the findings and recommendations with the patient.
If applicable, a reminder letter will be sent to the patient
regarding the next appointment.

BI-RADS CATEGORY  2: Benign.

## 2020-12-01 ENCOUNTER — Ambulatory Visit: Payer: BC Managed Care – PPO | Admitting: Family Medicine

## 2020-12-01 ENCOUNTER — Ambulatory Visit: Admit: 2020-12-01 | Discharge status: Absent without leave | Payer: BC Managed Care – PPO

## 2020-12-10 ENCOUNTER — Other Ambulatory Visit: Payer: Self-pay | Admitting: Family Medicine

## 2020-12-10 DIAGNOSIS — Z1231 Encounter for screening mammogram for malignant neoplasm of breast: Secondary | ICD-10-CM

## 2020-12-22 ENCOUNTER — Encounter: Payer: Self-pay | Admitting: Podiatry

## 2020-12-22 ENCOUNTER — Other Ambulatory Visit: Payer: Self-pay

## 2020-12-22 ENCOUNTER — Ambulatory Visit: Payer: BC Managed Care – PPO | Admitting: Podiatry

## 2020-12-22 DIAGNOSIS — M778 Other enthesopathies, not elsewhere classified: Secondary | ICD-10-CM

## 2020-12-22 NOTE — Progress Notes (Signed)
She presents today for follow-up of her capsulitis second metatarsophalangeal joint of the right foot states that it hurts all the way up the foot at this point.  She states that I noted that you probably need surgery but I just do not have time to do it as of yet.  Objective: Vital signs are stable alert oriented x3.  Pulses are palpable.  She has severe pain on palpation of the second metatarsophalangeal joint and and on end range of motion.  She has tenderness at the level of the first metatarsal medial cuneiform joint as well.  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  Requesting MRI to rule out a tear of the plantar plate and joint capsule follow-up with her after the MRI.

## 2021-01-29 ENCOUNTER — Encounter: Payer: Self-pay | Admitting: Family Medicine

## 2021-01-31 ENCOUNTER — Other Ambulatory Visit: Payer: Self-pay | Admitting: Family Medicine

## 2021-01-31 DIAGNOSIS — G479 Sleep disorder, unspecified: Secondary | ICD-10-CM

## 2021-01-31 MED ORDER — TRAZODONE HCL 50 MG PO TABS: 50.0000 mg | ORAL_TABLET | Freq: Every evening | ORAL | 3 refills | Status: DC | PRN

## 2021-03-14 ENCOUNTER — Other Ambulatory Visit: Payer: Self-pay

## 2021-03-14 ENCOUNTER — Ambulatory Visit
Admission: RE | Admit: 2021-03-14 | Discharge: 2021-03-14 | Disposition: A | Discharge status: Home / Self Care | Payer: BC Managed Care – PPO | Source: Ambulatory Visit | Attending: Family Medicine | Admitting: Family Medicine

## 2021-03-14 DIAGNOSIS — Z1231 Encounter for screening mammogram for malignant neoplasm of breast: Secondary | ICD-10-CM

## 2021-04-20 ENCOUNTER — Other Ambulatory Visit: Payer: Self-pay | Admitting: Podiatry

## 2021-04-20 ENCOUNTER — Encounter: Payer: Self-pay | Admitting: Podiatry

## 2021-04-20 ENCOUNTER — Ambulatory Visit: Payer: BC Managed Care – PPO | Admitting: Podiatry

## 2021-04-20 ENCOUNTER — Telehealth: Payer: Self-pay | Admitting: Podiatry

## 2021-04-20 DIAGNOSIS — L600 Ingrowing nail: Secondary | ICD-10-CM

## 2021-04-20 MED ORDER — NEOMYCIN-POLYMYXIN-HC 1 % OT SOLN: OTIC | 1 refills | Status: AC

## 2021-04-20 MED ORDER — DOXYCYCLINE HYCLATE 100 MG PO TABS: 100.0000 mg | ORAL_TABLET | Freq: Two times a day (BID) | ORAL | 0 refills | Status: DC

## 2021-04-20 NOTE — Telephone Encounter (Signed)
Pt called and was just in the office for a ingrown removal and was to have an  oral antibiotic called into and the drops were called in instead and she already has the drops at home. Could she please get the oral antibiotics called in to the [facility] in [place]. ?

## 2021-04-20 NOTE — Progress Notes (Signed)
She presents today chief complaint of a painful ingrown nail medial border of the hallux left states that it is been bothering her times several months but she woke up yesterday it was swollen and red. ? ?Objective: Vital signs are stable alert and oriented x3 there is erythema and edema there is a abscess to the distal medial toe.  Mild cellulitis to the level of the interphalangeal joint. ? ?Assessment: Pain in limb secondary to ingrown toenail and paronychia with early cellulitic process. ? ?Plan: After local anesthetic was administered chemical matricectomy was performed with an I&D removing a spicule of nail from the tip of the toe and ridding of all necrotic tissue.  Tolerated procedure well.  She is provided a prescription for doxycycline and she already has the Cortisporin otic drops at home she will apply after soaking twice daily.  She was provided both oral and home-going instructions for care and follow-up of the toe I will follow-up with her in 2 weeks ?

## 2021-04-20 NOTE — Patient Instructions (Signed)

## 2021-04-20 NOTE — Telephone Encounter (Signed)
Doxycycline sent into pharmacy 

## 2021-05-02 ENCOUNTER — Ambulatory Visit: Payer: BC Managed Care – PPO | Admitting: Podiatry

## 2021-05-04 ENCOUNTER — Ambulatory Visit: Payer: BC Managed Care – PPO | Admitting: Podiatry

## 2021-05-04 DIAGNOSIS — L600 Ingrowing nail: Secondary | ICD-10-CM

## 2021-05-04 NOTE — Progress Notes (Signed)
Presents today states that is doing much better she refers to the hallux left. ? ?Objective: Vital signs are stable she is alert and oriented x3 there is mild erythema no edema cellulitis drainage or odor. ? ?Assessment: Slowly resolving matrixectomy hallux left. ? ?Plan: Continue Epsom salts and warm water soaks covered in the day leave open at bedtime.  Follow-up with me if it does not going to heal within the next 2 weeks. ?

## 2021-05-06 ENCOUNTER — Encounter: Payer: Self-pay | Admitting: Podiatry

## 2021-05-16 ENCOUNTER — Telehealth: Payer: Self-pay | Admitting: Podiatry

## 2021-05-16 NOTE — Telephone Encounter (Signed)
I called and spoke to pt and at the time we only had morning available appts and I told pt I would call her back. I then had an opening tomorrow at 415 and I scheduled pt and left a message for pt to call to confirm if this would work for her. She called back and is not able to keep the appt tomorrow 5.9 as her husband has an appt tomorrow.  ? ?

## 2021-05-17 ENCOUNTER — Ambulatory Visit: Payer: BC Managed Care – PPO | Admitting: Podiatry

## 2021-06-08 ENCOUNTER — Ambulatory Visit (INDEPENDENT_AMBULATORY_CARE_PROVIDER_SITE_OTHER): Payer: BC Managed Care – PPO | Admitting: Podiatry

## 2021-06-08 ENCOUNTER — Encounter: Payer: Self-pay | Admitting: Podiatry

## 2021-06-08 DIAGNOSIS — L03032 Cellulitis of left toe: Secondary | ICD-10-CM | POA: Diagnosis not present

## 2021-06-08 MED ORDER — MUPIROCIN 2 % EX OINT: 1.0000 "application " | TOPICAL_OINTMENT | Freq: Two times a day (BID) | CUTANEOUS | 0 refills | Status: AC

## 2021-06-08 MED ORDER — AMOXICILLIN-POT CLAVULANATE 875-125 MG PO TABS: 1.0000 | ORAL_TABLET | Freq: Two times a day (BID) | ORAL | 0 refills | Status: DC

## 2021-06-08 NOTE — Progress Notes (Signed)
She presents today for follow-up of her Penate paronychia hallux left.  She states that she went by her PCP and got doxycycline for 14 days and it still looks a little red but it is doing much better than it was previously.  Objective: Vital signs are stable she alert oriented x3 tibial border hallux left does demonstrate some mild erythema there is still a small area of purulence though not enough to culture.  Assessment: Paronychia hallux left tibial border.  Plan: Start her on Augmentin 875 1 p.o. twice daily and Bactroban ointment a small amount applied after soaking covered in the day leave open at bedtime.  Follow-up with her in 2 weeks

## 2021-06-11 ENCOUNTER — Encounter: Payer: Self-pay | Admitting: Emergency Medicine

## 2021-06-11 ENCOUNTER — Emergency Department: Payer: BC Managed Care – PPO

## 2021-06-11 ENCOUNTER — Other Ambulatory Visit: Payer: Self-pay

## 2021-06-11 ENCOUNTER — Emergency Department
Admission: EM | Admit: 2021-06-11 | Discharge: 2021-06-11 | Disposition: A | Discharge status: Home / Self Care | Payer: BC Managed Care – PPO | Attending: Emergency Medicine | Admitting: Emergency Medicine

## 2021-06-11 DIAGNOSIS — N132 Hydronephrosis with renal and ureteral calculous obstruction: Secondary | ICD-10-CM | POA: Diagnosis not present

## 2021-06-11 DIAGNOSIS — I10 Essential (primary) hypertension: Secondary | ICD-10-CM | POA: Insufficient documentation

## 2021-06-11 DIAGNOSIS — N200 Calculus of kidney: Secondary | ICD-10-CM

## 2021-06-11 DIAGNOSIS — R300 Dysuria: Secondary | ICD-10-CM | POA: Diagnosis present

## 2021-06-11 LAB — URINALYSIS, ROUTINE W REFLEX MICROSCOPIC
Bilirubin Urine: NEGATIVE
Glucose, UA: NEGATIVE mg/dL
Hgb urine dipstick: NEGATIVE
Ketones, ur: NEGATIVE mg/dL
Leukocytes,Ua: NEGATIVE
Nitrite: NEGATIVE
Protein, ur: NEGATIVE mg/dL
Specific Gravity, Urine: 1.017 (ref 1.005–1.030)
pH: 7 (ref 5.0–8.0)

## 2021-06-11 MED ORDER — ONDANSETRON HCL 4 MG/2ML IJ SOLN
4.0000 mg | Freq: Once | INTRAMUSCULAR | Status: AC
  Administered 2021-06-11: 4 mg via INTRAVENOUS
  Filled 2021-06-11: qty 2

## 2021-06-11 MED ORDER — FENTANYL CITRATE PF 50 MCG/ML IJ SOSY
50.0000 ug | PREFILLED_SYRINGE | Freq: Once | INTRAMUSCULAR | Status: AC
  Administered 2021-06-11: 50 ug via INTRAVENOUS
  Filled 2021-06-11: qty 1

## 2021-06-11 MED ORDER — HYDROCODONE-ACETAMINOPHEN 5-325 MG PO TABS: 1.0000 | ORAL_TABLET | Freq: Four times a day (QID) | ORAL | 0 refills | Status: AC | PRN

## 2021-06-11 MED ORDER — SODIUM CHLORIDE 0.9 % IV BOLUS
1000.0000 mL | Freq: Once | INTRAVENOUS | Status: AC
  Administered 2021-06-11: 1000 mL via INTRAVENOUS

## 2021-06-11 MED ORDER — TAMSULOSIN HCL 0.4 MG PO CAPS: 0.4000 mg | ORAL_CAPSULE | Freq: Every day | ORAL | 0 refills | Status: AC

## 2021-06-11 MED ORDER — KETOROLAC TROMETHAMINE 30 MG/ML IJ SOLN
30.0000 mg | Freq: Once | INTRAMUSCULAR | Status: AC
  Administered 2021-06-11: 30 mg via INTRAVENOUS
  Filled 2021-06-11: qty 1

## 2021-06-11 MED ORDER — KETOROLAC TROMETHAMINE 10 MG PO TABS: 10.0000 mg | ORAL_TABLET | Freq: Four times a day (QID) | ORAL | 0 refills | Status: AC | PRN

## 2021-06-11 NOTE — ED Notes (Signed)
Pt reports lower back pain.  

## 2021-06-11 NOTE — Discharge Instructions (Signed)
Follow-up with Dr. Lonna Cobb.  Please call for an appointment.  We can see anyone in his group.  Take the medication as prescribed.  Once you have passed the stone you can stop the Toradol and Flomax.  Return emergency department if you are worsening

## 2021-06-11 NOTE — ED Triage Notes (Signed)
Pt reports dull back pain for several days and now has urinary urgency, frequency and dysuria. Pt concerned she has a UTI. Reports pain is on both sides. Pt states she is already taking Augmentin for a toe infection, has been on it since Wednesday.

## 2021-06-11 NOTE — ED Provider Notes (Signed)
[facility] Provider Note    Event Date/Time   First MD Initiated Contact with Patient 06/11/21 1319     (approximate)   History   Back Pain, Urinary Frequency, and Dysuria   HPI  Erin Frye is a 51 y.o. female with history of hypertension, anemia, prediabetes presents emergency department complaining of low back pain.  Patient states she has had a lot of urinary frequency where she feels like she has to go and she cannot go.  The back pain is coming in waves.  No history of ovarian cyst.  No history of kidney stones.  She denies any fever chills/vomiting       Physical Exam   Triage Vital Signs: ED Triage Vitals  Enc Vitals Group     BP 06/11/21 1253 (!) 152/93     Pulse Rate 06/11/21 1253 86     Resp 06/11/21 1253 18     Temp 06/11/21 1253 97.9 F (36.6 C)     Temp Source 06/11/21 1253 Oral     SpO2 06/11/21 1253 96 %     Weight 06/11/21 1245 246 lb 14.6 oz (112 kg)     Height 06/11/21 1245 5\' 3"  (1.6 m)     Head Circumference --      Peak Flow --      Pain Score 06/11/21 1245 8     Pain Loc --      Pain Edu? --      Excl. in GC? --     Most recent vital signs: Vitals:   06/11/21 1253 06/11/21 1500  BP: (!) 152/93 138/77  Pulse: 86 86  Resp: 18 16  Temp: 97.9 F (36.6 C)   SpO2: 96% 99%     General: Awake, no distress.   CV:  Good peripheral perfusion. regular rate and  rhythm Resp:  Normal effort.  Abd:  No distention.   Other:  No CVA tenderness, no spinal tenderness, abdomen nontender   ED Results / Procedures / Treatments   Labs (all labs ordered are listed, but only abnormal results are displayed) Labs Reviewed  URINALYSIS, ROUTINE W REFLEX MICROSCOPIC - Abnormal; Notable for the following components:      Result Value   Color, Urine YELLOW (*)    APPearance CLEAR (*)    All other components within normal limits     EKG     RADIOLOGY CT renal  stone    PROCEDURES:   Procedures   MEDICATIONS ORDERED IN ED: Medications  fentaNYL (SUBLIMAZE) injection 50 mcg (50 mcg Intravenous Given 06/11/21 1409)  ondansetron (ZOFRAN) injection 4 mg (4 mg Intravenous Given 06/11/21 1408)  ketorolac (TORADOL) 30 MG/ML injection 30 mg (30 mg Intravenous Given 06/11/21 1459)  sodium chloride 0.9 % bolus 1,000 mL (1,000 mLs Intravenous New Bag/Given 06/11/21 1458)     IMPRESSION / MDM / ASSESSMENT AND PLAN / ED COURSE  I reviewed the triage vital signs and the nursing notes.                              Differential diagnosis includes, but is not limited to, muscle strain, kidney stone, pyelonephritis, UTI  Patient's presentation is most consistent with acute presentation with potential threat to life or bodily function.   With concerns of pyelonephritis and kidney stone felt patient will need a CT renal stone study, her labs are reassuring, her CBC metabolic panel and urinalysis  are normal.  I did offer the patient pain medication.  However she states everything makes her itch.  We will wait to give her Toradol once CT is completed.  CT interpreted by me as being positive for a kidney stone.  3 mm at the UVJ.  I did explain these findings to the patient.  I do feel she can pass his kidney stone as it is only 3 mm, gave her Toradol 30 mg IV and 1 L normal saline.  We will also give her prescription for Toradol, Flomax and hydrocodone.  Patient is in agreement treatment plan.  She is to follow-up with urology.  Her husband sees Dr. Lonna Cobb so we will refer her to the same group.  She is to strain her urine to catch the stone.  I gave her a sterile cup to place the stone and if she does catch it.  She was discharged stable condition.       FINAL CLINICAL IMPRESSION(S) / ED DIAGNOSES   Final diagnoses:  Kidney stone     Rx / DC Orders   ED Discharge Orders          Ordered    ketorolac (TORADOL) 10 MG tablet  Every 6 hours PRN         06/11/21 1500    tamsulosin (FLOMAX) 0.4 MG CAPS capsule  Daily        06/11/21 1500    HYDROcodone-acetaminophen (NORCO/VICODIN) 5-325 MG tablet  Every 6 hours PRN        06/11/21 1500             Note:  This document was prepared using Dragon voice recognition software and may include unintentional dictation errors.    Faythe Ghee, PA-C 06/11/21 1505    Willy Eddy, MD 06/12/21 [id number]

## 2021-06-22 ENCOUNTER — Ambulatory Visit: Payer: BC Managed Care – PPO | Admitting: Podiatry

## 2021-06-27 ENCOUNTER — Ambulatory Visit: Payer: BC Managed Care – PPO | Admitting: Podiatry

## 2021-06-27 ENCOUNTER — Encounter: Payer: Self-pay | Admitting: Podiatry

## 2021-06-27 DIAGNOSIS — L03032 Cellulitis of left toe: Secondary | ICD-10-CM

## 2021-06-27 NOTE — Progress Notes (Signed)
She presents today for follow-up of her paronychia hallux left states that is doing much better after taking the Augmentin.  She still has pain in the right ankle however I would like to schedule surgery for November through December.  Objective: Vital signs are stable alert oriented x3 the toe has gone on to heal uneventfully no erythema edema cellulitis drainage or odor.  Still has severe pain on palpation of the posterior tibial tendon of the right foot.  She also has digital deformity second digit right foot.  Assessment: Severe hammertoe deformity possible tear of the plantar plate right foot.  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  Well-healing surgical toe.  Plan: Discussed etiology pathology and surgical therapies at this point discontinue treatment for the toenail procedure.  Follow-up with me in early October for a forefoot and rear foot MRI 4.  Posterior tibial tendon repair and a capsular tear of the second metatarsophalangeal joint.

## 2021-10-08 ENCOUNTER — Encounter: Payer: Self-pay | Admitting: Podiatry

## 2021-10-11 ENCOUNTER — Other Ambulatory Visit: Payer: Self-pay | Admitting: Podiatry

## 2021-10-11 DIAGNOSIS — T148XXA Other injury of unspecified body region, initial encounter: Secondary | ICD-10-CM

## 2021-10-11 DIAGNOSIS — M66871 Spontaneous rupture of other tendons, right ankle and foot: Secondary | ICD-10-CM

## 2022-03-10 ENCOUNTER — Other Ambulatory Visit: Payer: Self-pay | Admitting: Family Medicine

## 2022-03-10 DIAGNOSIS — G479 Sleep disorder, unspecified: Secondary | ICD-10-CM

## 2022-04-10 ENCOUNTER — Other Ambulatory Visit: Payer: Self-pay | Admitting: Family Medicine

## 2022-04-10 DIAGNOSIS — Z1231 Encounter for screening mammogram for malignant neoplasm of breast: Secondary | ICD-10-CM

## 2022-05-02 ENCOUNTER — Ambulatory Visit
Admission: RE | Admit: 2022-05-02 | Discharge: 2022-05-02 | Disposition: A | Discharge status: Home / Self Care | Payer: BC Managed Care – PPO | Source: Ambulatory Visit | Attending: Family Medicine | Admitting: Family Medicine

## 2022-05-02 DIAGNOSIS — Z1231 Encounter for screening mammogram for malignant neoplasm of breast: Secondary | ICD-10-CM | POA: Diagnosis present

## 2023-06-25 ENCOUNTER — Other Ambulatory Visit: Payer: Self-pay | Admitting: Family Medicine

## 2023-06-25 DIAGNOSIS — Z1231 Encounter for screening mammogram for malignant neoplasm of breast: Secondary | ICD-10-CM

## 2023-08-01 LAB — COLOGUARD: COLOGUARD: NEGATIVE

## 2023-08-07 ENCOUNTER — Ambulatory Visit
Admission: RE | Admit: 2023-08-07 | Discharge: 2023-08-07 | Disposition: A | Discharge status: Home / Self Care | Payer: Self-pay | Source: Ambulatory Visit | Attending: Family Medicine | Admitting: Family Medicine

## 2023-08-07 DIAGNOSIS — Z1231 Encounter for screening mammogram for malignant neoplasm of breast: Secondary | ICD-10-CM | POA: Diagnosis present

## 2023-08-15 ENCOUNTER — Ambulatory Visit (INDEPENDENT_AMBULATORY_CARE_PROVIDER_SITE_OTHER)

## 2023-08-15 ENCOUNTER — Ambulatory Visit: Admitting: Podiatry

## 2023-08-15 DIAGNOSIS — M19071 Primary osteoarthritis, right ankle and foot: Secondary | ICD-10-CM

## 2023-08-15 DIAGNOSIS — M66871 Spontaneous rupture of other tendons, right ankle and foot: Secondary | ICD-10-CM | POA: Diagnosis not present

## 2023-08-15 DIAGNOSIS — T148XXA Other injury of unspecified body region, initial encounter: Secondary | ICD-10-CM

## 2023-08-15 MED ORDER — TRIAMCINOLONE ACETONIDE 40 MG/ML IJ SUSP
20.0000 mg | Freq: Once | INTRAMUSCULAR | Status: AC
  Administered 2023-08-15: 20 mg

## 2023-08-15 MED ORDER — METHYLPREDNISOLONE 4 MG PO TBPK
ORAL_TABLET | ORAL | 0 refills | Status: AC
Start: 2023-08-15 — End: ?

## 2023-08-15 NOTE — Progress Notes (Signed)
 She presents today with a 25-month history of pain to her right foot.  She states that it hurts right here she points to the sinus tarsi of the right foot.  She also has swelling along the medial side and she points to that.  Objective: Vital signs are stable alert oriented x 3.  Pulses are palpable.  She has palpable fluctuance in the posterior tibial tendon sheath around the posterior superior aspect of the medial malleolus extending to the navicular tuberosity.  She has pain on inversion against resistance.  Mild pes planovalgus is noted and flexible.  She also has pain on palpation of end range of motion of the subtalar joint passively and actively.  Tenderness is noted on palpation of the sinus tarsi.  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  Plan: I injected the area today with 10 mg of Kenalog  5 mg Marcaine point maximal tenderness was the sinus tarsi.  I also started her on methylprednisolone  she will follow that up with her meloxicam  which she already has.  We did discuss appropriate shoe gear.  I will follow-up with her in a few weeks.

## 2023-10-10 ENCOUNTER — Ambulatory Visit: Admitting: Podiatry
